# Patient Record
Sex: Male | Born: 1965
Health system: Southern US, Community
[De-identification: ages and names within clinical notes are randomized; demographics above are authoritative.]

## PROBLEM LIST (undated history)

## (undated) DIAGNOSIS — C801 Malignant (primary) neoplasm, unspecified: Secondary | ICD-10-CM

## (undated) DIAGNOSIS — K219 Gastro-esophageal reflux disease without esophagitis: Secondary | ICD-10-CM

## (undated) DIAGNOSIS — C140 Malignant neoplasm of pharynx, unspecified: Secondary | ICD-10-CM

## (undated) DIAGNOSIS — Z8547 Personal history of malignant neoplasm of testis: Secondary | ICD-10-CM

## (undated) DIAGNOSIS — I10 Essential (primary) hypertension: Secondary | ICD-10-CM

## (undated) DIAGNOSIS — E7800 Pure hypercholesterolemia, unspecified: Secondary | ICD-10-CM

## (undated) DIAGNOSIS — C01 Malignant neoplasm of base of tongue: Secondary | ICD-10-CM

## (undated) DIAGNOSIS — E785 Hyperlipidemia, unspecified: Secondary | ICD-10-CM

## (undated) HISTORY — PX: TONSILLECTOMY: SUR1361

## (undated) HISTORY — PX: KNEE SURGERY: SHX244

## (undated) HISTORY — PX: RADICAL ORCHIECTOMY: SHX2285

## (undated) HISTORY — DX: Personal history of malignant neoplasm of testis: Z85.47

## (undated) HISTORY — PX: CHOLECYSTECTOMY: SHX55

## (undated) HISTORY — DX: Malignant (primary) neoplasm, unspecified: C80.1

## (undated) HISTORY — PX: TESTICLE SURGERY: SHX794

## (undated) HISTORY — DX: Essential (primary) hypertension: I10

## (undated) HISTORY — DX: Hyperlipidemia, unspecified: E78.5

## (undated) HISTORY — PX: HERNIA REPAIR: SHX51

## (undated) HISTORY — DX: Gastro-esophageal reflux disease without esophagitis: K21.9

## (undated) HISTORY — DX: Malignant neoplasm of base of tongue: C01

## (undated) HISTORY — DX: Malignant neoplasm of pharynx, unspecified: C14.0

## (undated) HISTORY — PX: COLONOSCOPY: SHX174

## (undated) MED FILL — Ferric Derisomaltose (One Dose) IV Sol 1000 MG/10ML (Fe Eq): INTRAVENOUS | Qty: 10 | Status: AC

## (undated) MED FILL — Ferumoxytol Inj 510 MG/17ML (30 MG/ML) (Elemental Fe): INTRAVENOUS | Qty: 17 | Status: AC

---

## 2013-08-06 DIAGNOSIS — C6290 Malignant neoplasm of unspecified testis, unspecified whether descended or undescended: Secondary | ICD-10-CM

## 2013-08-06 HISTORY — DX: Malignant neoplasm of unspecified testis, unspecified whether descended or undescended: C62.90

## 2013-08-19 DIAGNOSIS — I10 Essential (primary) hypertension: Secondary | ICD-10-CM

## 2013-08-19 DIAGNOSIS — E785 Hyperlipidemia, unspecified: Secondary | ICD-10-CM | POA: Insufficient documentation

## 2013-08-19 HISTORY — DX: Essential (primary) hypertension: I10

## 2015-01-26 ENCOUNTER — Ambulatory Visit (INDEPENDENT_AMBULATORY_CARE_PROVIDER_SITE_OTHER): Payer: BLUE CROSS/BLUE SHIELD | Admitting: Emergency Medicine

## 2015-01-26 VITALS — BP 130/84 | HR 60 | Temp 98.0°F | Resp 18 | Ht 69.0 in | Wt 260.4 lb

## 2015-01-26 DIAGNOSIS — C6290 Malignant neoplasm of unspecified testis, unspecified whether descended or undescended: Secondary | ICD-10-CM | POA: Insufficient documentation

## 2015-01-26 DIAGNOSIS — C6291 Malignant neoplasm of right testis, unspecified whether descended or undescended: Secondary | ICD-10-CM | POA: Diagnosis not present

## 2015-01-26 DIAGNOSIS — I10 Essential (primary) hypertension: Secondary | ICD-10-CM | POA: Diagnosis not present

## 2015-01-26 HISTORY — DX: Malignant neoplasm of unspecified testis, unspecified whether descended or undescended: C62.90

## 2015-01-26 MED ORDER — HYDROCHLOROTHIAZIDE 25 MG PO TABS: 25.0000 mg | ORAL_TABLET | Freq: Every day | ORAL | Status: DC

## 2015-01-26 MED ORDER — LISINOPRIL 20 MG PO TABS: 20.0000 mg | ORAL_TABLET | Freq: Every day | ORAL | Status: DC

## 2015-01-26 NOTE — Progress Notes (Signed)
Subjective:  Patient ID: Matthew Hatfield, male    DOB: 04-Apr-1965  Age: 49 y.o. MRN: [id number]  CC: Medication Refill   HPI Matthew Hatfield presents  he has a history of hypertension and testicular cancer. He is needing to establish care with a new practitioner his regular doctor is closed. He has some medicine remaining but is nearly out. He recently had labs he said it is regular doctor and they were "normal" he has no acute complaint or medical bleeding.  History Osa has a past medical history of Cancer ([place]) and Hypertension.   He has past surgical history that includes Cholecystectomy and Hernia repair.   His  family history includes Cancer in his father and mother; Heart disease in his mother.  He   reports that he has never smoked. He does not have any smokeless tobacco history on file. He reports that he does not drink alcohol or use illicit drugs.  No outpatient prescriptions prior to visit.   No facility-administered medications prior to visit.    Social History   Social History  . Marital Status: Married    Spouse Name: N/A  . Number of Children: N/A  . Years of Education: N/A   Social History Main Topics  . Smoking status: Never Smoker   . Smokeless tobacco: None  . Alcohol Use: No  . Drug Use: No  . Sexual Activity: Not Asked   Other Topics Concern  . None   Social History Narrative  . None     Review of Systems  Constitutional: Negative for fever, chills and appetite change.  HENT: Negative for congestion, ear pain, postnasal drip, sinus pressure and sore throat.   Eyes: Negative for pain and redness.  Respiratory: Negative for cough, shortness of breath and wheezing.   Cardiovascular: Negative for leg swelling.  Gastrointestinal: Negative for nausea, vomiting, abdominal pain, diarrhea, constipation and blood in stool.  Endocrine: Negative for polyuria.  Genitourinary: Negative for dysuria, urgency, frequency and flank pain.    Musculoskeletal: Negative for gait problem.  Skin: Negative for rash.  Neurological: Negative for weakness and headaches.  Psychiatric/Behavioral: Negative for confusion and decreased concentration. The patient is not nervous/anxious.     Objective:  BP 130/84 mmHg  Pulse 60  Temp(Src) 98 F (36.7 C) (Oral)  Resp 18  Ht 5\' 9"  (1.753 m)  Wt 260 lb 6.4 oz (118.117 kg)  BMI 38.44 kg/m2  SpO2 96%  Physical Exam  Constitutional: He is oriented to person, place, and time. He appears well-developed and well-nourished. No distress.  HENT:  Head: Normocephalic and atraumatic.  Right Ear: External ear normal.  Left Ear: External ear normal.  Nose: Nose normal.  Eyes: Conjunctivae and EOM are normal. Pupils are equal, round, and reactive to light. No scleral icterus.  Neck: Normal range of motion. Neck supple. No tracheal deviation present.  Cardiovascular: Normal rate, regular rhythm and normal heart sounds.   Pulmonary/Chest: Effort normal. No respiratory distress. He has no wheezes. He has no rales.  Abdominal: He exhibits no mass. There is no tenderness. There is no rebound and no guarding.  Musculoskeletal: He exhibits no edema.  Lymphadenopathy:    He has no cervical adenopathy.  Neurological: He is alert and oriented to person, place, and time. Coordination normal.  Skin: Skin is warm and dry. No rash noted.  Psychiatric: He has a normal mood and affect. His behavior is normal.      Assessment & Plan:   Matthew was seen  today for medication refill.  Diagnoses and all orders for this visit:  Essential hypertension, benign  Malignant neoplasm of right testis, unspecified whether descended or undescended ([place])  Other orders -     lisinopril (PRINIVIL,ZESTRIL) 20 MG tablet; Take 1 tablet (20 mg total) by mouth daily. -     hydrochlorothiazide (HYDRODIURIL) 25 MG tablet; Take 1 tablet (25 mg total) by mouth daily.   I have discontinued Mr. Hatfield HYDROCHLOROTHIAZIDE  PO. I have also changed his lisinopril and hydrochlorothiazide.  Meds ordered this encounter  Medications  . DISCONTD: lisinopril (PRINIVIL,ZESTRIL) 20 MG tablet    Sig: Take 20 mg by mouth daily.  Marland Kitchen DISCONTD: HYDROCHLOROTHIAZIDE PO    Sig: Take 10 mg by mouth.  . DISCONTD: hydrochlorothiazide (HYDRODIURIL) 25 MG tablet    Sig: Take 25 mg by mouth daily.  Marland Kitchen lisinopril (PRINIVIL,ZESTRIL) 20 MG tablet    Sig: Take 1 tablet (20 mg total) by mouth daily.    Dispense:  30 tablet    Refill:  5  . hydrochlorothiazide (HYDRODIURIL) 25 MG tablet    Sig: Take 1 tablet (25 mg total) by mouth daily.    Dispense:  30 tablet    Refill:  5    Appropriate red flag conditions were discussed with the patient as well as actions that should be taken.  Patient expressed his understanding.  Follow-up: Return in about 6 months (around 07/27/2015).  Roselee Culver, MD

## 2015-01-26 NOTE — Patient Instructions (Signed)
Hypertension Hypertension, commonly called high blood pressure, is when the force of blood pumping through your arteries is too strong. Your arteries are the blood vessels that carry blood from your heart throughout your body. A blood pressure reading consists of a higher number over a lower number, such as 110/72. The higher number (systolic) is the pressure inside your arteries when your heart pumps. The lower number (diastolic) is the pressure inside your arteries when your heart relaxes. Ideally you want your blood pressure below 120/80. Hypertension forces your heart to work harder to pump blood. Your arteries may become narrow or stiff. Having untreated or uncontrolled hypertension can cause heart attack, stroke, kidney disease, and other problems. RISK FACTORS Some risk factors for high blood pressure are controllable. Others are not.  Risk factors you cannot control include:   Race. You may be at higher risk if you are African American.  Age. Risk increases with age.  Gender. Men are at higher risk than women before age 45 years. After age 65, women are at higher risk than men. Risk factors you can control include:  Not getting enough exercise or physical activity.  Being overweight.  Getting too much fat, sugar, calories, or salt in your diet.  Drinking too much alcohol. SIGNS AND SYMPTOMS Hypertension does not usually cause signs or symptoms. Extremely high blood pressure (hypertensive crisis) may cause headache, anxiety, shortness of breath, and nosebleed. DIAGNOSIS To check if you have hypertension, your health care provider will measure your blood pressure while you are seated, with your arm held at the level of your heart. It should be measured at least twice using the same arm. Certain conditions can cause a difference in blood pressure between your right and left arms. A blood pressure reading that is higher than normal on one occasion does not mean that you need treatment. If  it is not clear whether you have high blood pressure, you may be asked to return on a different day to have your blood pressure checked again. Or, you may be asked to monitor your blood pressure at home for 1 or more weeks. TREATMENT Treating high blood pressure includes making lifestyle changes and possibly taking medicine. Living a healthy lifestyle can help lower high blood pressure. You may need to change some of your habits. Lifestyle changes may include:  Following the DASH diet. This diet is high in fruits, vegetables, and whole grains. It is low in salt, red meat, and added sugars.  Keep your sodium intake below 2,300 mg per day.  Getting at least 30-45 minutes of aerobic exercise at least 4 times per week.  Losing weight if necessary.  Not smoking.  Limiting alcoholic beverages.  Learning ways to reduce stress. Your health care provider may prescribe medicine if lifestyle changes are not enough to get your blood pressure under control, and if one of the following is true:  You are 18-59 years of age and your systolic blood pressure is above 140.  You are 60 years of age or older, and your systolic blood pressure is above 150.  Your diastolic blood pressure is above 90.  You have diabetes, and your systolic blood pressure is over 140 or your diastolic blood pressure is over 90.  You have kidney disease and your blood pressure is above 140/90.  You have heart disease and your blood pressure is above 140/90. Your personal target blood pressure may vary depending on your medical conditions, your age, and other factors. HOME CARE INSTRUCTIONS    Have your blood pressure rechecked as directed by your health care provider.   Take medicines only as directed by your health care provider. Follow the directions carefully. Blood pressure medicines must be taken as prescribed. The medicine does not work as well when you skip doses. Skipping doses also puts you at risk for  problems.  Do not smoke.   Monitor your blood pressure at home as directed by your health care provider. SEEK MEDICAL CARE IF:   You think you are having a reaction to medicines taken.  You have recurrent headaches or feel dizzy.  You have swelling in your ankles.  You have trouble with your vision. SEEK IMMEDIATE MEDICAL CARE IF:  You develop a severe headache or confusion.  You have unusual weakness, numbness, or feel faint.  You have severe chest or abdominal pain.  You vomit repeatedly.  You have trouble breathing. MAKE SURE YOU:   Understand these instructions.  Will watch your condition.  Will get help right away if you are not doing well or get worse.   This information is not intended to replace advice given to you by your health care provider. Make sure you discuss any questions you have with your health care provider.   Document Released: 01/30/2005 Document Revised: 06/16/2014 Document Reviewed: 11/22/2012 Elsevier Interactive Patient Education 2016 Elsevier Inc.  

## 2015-02-08 ENCOUNTER — Ambulatory Visit (INDEPENDENT_AMBULATORY_CARE_PROVIDER_SITE_OTHER): Payer: BLUE CROSS/BLUE SHIELD | Admitting: Family Medicine

## 2015-02-08 VITALS — BP 166/80 | HR 81 | Temp 98.5°F | Resp 18 | Ht 69.0 in | Wt 258.0 lb

## 2015-02-08 DIAGNOSIS — J683 Other acute and subacute respiratory conditions due to chemicals, gases, fumes and vapors: Secondary | ICD-10-CM

## 2015-02-08 DIAGNOSIS — J4520 Mild intermittent asthma, uncomplicated: Secondary | ICD-10-CM

## 2015-02-08 MED ORDER — HYDROCODONE-HOMATROPINE 5-1.5 MG/5ML PO SYRP: 5.0000 mL | ORAL_SOLUTION | Freq: Three times a day (TID) | ORAL | Status: DC | PRN

## 2015-02-08 MED ORDER — ALBUTEROL SULFATE 108 (90 BASE) MCG/ACT IN AEPB: 2.0000 | INHALATION_SPRAY | Freq: Four times a day (QID) | RESPIRATORY_TRACT | Status: DC | PRN

## 2015-02-08 MED ORDER — AZITHROMYCIN 250 MG PO TABS: ORAL_TABLET | ORAL | Status: DC

## 2015-02-08 NOTE — Patient Instructions (Signed)

## 2015-02-08 NOTE — Progress Notes (Signed)
@UMFCLOGO @  By signing my name below, I, Raven Small, attest that this documentation has been prepared under the direction and in the presence of Robyn Haber, MD.  Electronically Signed: Thea Alken, ED Scribe. 02/08/2015. 5:29 PM.   Patient ID: Matthew Hatfield MRN: [id number], DOB: 1965-09-15, 49 y.o. Date of Encounter: 02/08/2015, 5:26 PM  Primary Physician: No PCP Per Patient  Chief Complaint:  Chief Complaint  Patient presents with  . Cough    Productive onset 3 days    HPI: 49 y.o. year old male with history below presents with cough. Symptoms started 2 days ago with sore throat but progressively worsened yesterday with productive cough and pain with swallowing. Pt states he has not eaten today due to sore throat. He reports SOB only with coughing fits.  He denies hx of asthma. He denies nausea and emesis.   Pt works as a Advice worker.   Past Medical History  Diagnosis Date  . Cancer ([place])   . Hypertension      Home Meds: Prior to Admission medications   Medication Sig Start Date End Date Taking? Authorizing Provider  aspirin 81 MG chewable tablet Chew 81 mg by mouth.   Yes Historical Provider, MD  docusate sodium (STOOL SOFTENER) 100 MG capsule Take 100 mg by mouth.   Yes Historical Provider, MD  hydrochlorothiazide (HYDRODIURIL) 25 MG tablet Take 1 tablet (25 mg total) by mouth daily. 01/26/15  Yes Roselee Culver, MD  lisinopril (PRINIVIL,ZESTRIL) 20 MG tablet Take 1 tablet (20 mg total) by mouth daily. 01/26/15  Yes Roselee Culver, MD    Allergies:  Allergies  Allergen Reactions  . Shellfish-Derived Products Hives  . Antihistamines, Diphenhydramine-Type Palpitations    Social History   Social History  . Marital Status: Married    Spouse Name: N/A  . Number of Children: N/A  . Years of Education: N/A   Occupational History  . Not on file.   Social History Main Topics  . Smoking status: Never Smoker   . Smokeless tobacco: Not on file  .  Alcohol Use: No  . Drug Use: No  . Sexual Activity: Not on file   Other Topics Concern  . Not on file   Social History Narrative     Review of Systems: Constitutional: negative for chills, fever, night sweats, weight changes, or fatigue  HEENT: negative for vision changes, hearing loss, congestion, rhinorrhea, ST, epistaxis, or sinus pressure. Sore throat Cardiovascular: negative for chest pain or palpitations Respiratory: negative for hemoptysis, wheezing, shortness of breath. cough Abdominal: negative for abdominal pain, nausea, vomiting, diarrhea, or constipation Dermatological: negative for rash Neurologic: negative for headache, dizziness, or syncope All other systems reviewed and are otherwise negative with the exception to those above and in the HPI.   Physical Exam: Blood pressure 166/80, pulse 81, temperature 98.5 F (36.9 C), temperature source Oral, resp. rate 18, height 5\' 9"  (1.753 m), weight 258 lb (117.028 kg), SpO2 96 %., Body mass index is 38.08 kg/(m^2). General: Well developed, well nourished, in no acute distress. Head: Normocephalic, atraumatic, eyes without discharge, sclera non-icteric, nares are without discharge. Bilateral auditory canals clear, TM's are without perforation, pearly grey and translucent with reflective cone of light bilaterally. Oral cavity moist, posterior pharynx with erythema and post nasal drainage.  Neck: Supple. No thyromegaly. Full ROM. No lymphadenopathy. Lungs: inspiratory wheeze.  Heart: RRR with S1 S2. No murmurs, rubs, or gallops appreciated. Msk:  Strength and tone normal for age. Extremities/Skin: Warm and dry.  No clubbing or cyanosis. No edema. No rashes or suspicious lesions. Neuro: Alert and oriented X 3. Moves all extremities spontaneously. Gait is normal. CNII-XII grossly in tact. Psych:  Responds to questions appropriately with a normal affect.    ASSESSMENT AND PLAN:  49 y.o. year old male with reactive  airways/bronchitis    ICD-9-CM ICD-10-CM   1. Reactive airways dysfunction syndrome, mild intermittent, uncomplicated 123456 [id number] HYDROcodone-homatropine (HYCODAN) 5-1.5 MG/5ML syrup     azithromycin (ZITHROMAX) 250 MG tablet     Albuterol Sulfate (PROAIR RESPICLICK) 123XX123 (90 BASE) MCG/ACT AEPB  This chart was scribed in my presence and reviewed by me personally.  Signed, Robyn Haber, MD 02/08/2015 5:26 PM   ]

## 2015-12-03 ENCOUNTER — Other Ambulatory Visit: Payer: Self-pay

## 2015-12-03 MED ORDER — LISINOPRIL 20 MG PO TABS: 20.0000 mg | ORAL_TABLET | Freq: Every day | ORAL | 1 refills | Status: DC

## 2015-12-03 NOTE — Telephone Encounter (Signed)
Fax req for Lisinopril 20mg . Sent 60 days and advised he needs follow up prior to further refills. LMOVM & note to pharmacy.

## 2016-03-04 ENCOUNTER — Encounter (HOSPITAL_COMMUNITY): Payer: Self-pay | Admitting: Emergency Medicine

## 2016-03-04 ENCOUNTER — Emergency Department (HOSPITAL_COMMUNITY)
Admission: EM | Admit: 2016-03-04 | Discharge: 2016-03-04 | Disposition: A | Discharge status: Home / Self Care | Payer: 59 | Attending: Emergency Medicine | Admitting: Emergency Medicine

## 2016-03-04 DIAGNOSIS — S1121XA Laceration without foreign body of pharynx and cervical esophagus, initial encounter: Secondary | ICD-10-CM | POA: Diagnosis not present

## 2016-03-04 DIAGNOSIS — Z79899 Other long term (current) drug therapy: Secondary | ICD-10-CM | POA: Insufficient documentation

## 2016-03-04 DIAGNOSIS — Z7982 Long term (current) use of aspirin: Secondary | ICD-10-CM | POA: Insufficient documentation

## 2016-03-04 DIAGNOSIS — S1985XA Other specified injuries of pharynx and cervical esophagus, initial encounter: Secondary | ICD-10-CM | POA: Diagnosis present

## 2016-03-04 DIAGNOSIS — I10 Essential (primary) hypertension: Secondary | ICD-10-CM | POA: Insufficient documentation

## 2016-03-04 DIAGNOSIS — X58XXXA Exposure to other specified factors, initial encounter: Secondary | ICD-10-CM | POA: Insufficient documentation

## 2016-03-04 DIAGNOSIS — Z8547 Personal history of malignant neoplasm of testis: Secondary | ICD-10-CM | POA: Insufficient documentation

## 2016-03-04 DIAGNOSIS — Y939 Activity, unspecified: Secondary | ICD-10-CM | POA: Insufficient documentation

## 2016-03-04 DIAGNOSIS — Y929 Unspecified place or not applicable: Secondary | ICD-10-CM | POA: Diagnosis not present

## 2016-03-04 DIAGNOSIS — Y999 Unspecified external cause status: Secondary | ICD-10-CM | POA: Diagnosis not present

## 2016-03-04 NOTE — ED Triage Notes (Signed)
Pt reports he ate a nutty bar yesterday, which he felt got stuck. Piece of food passed, but pt has been spitting up blood. Went to UC for same yesterday, no sign of any retained food noted. Still spitting blood intermittently at home. None noted in triage.

## 2016-03-04 NOTE — ED Provider Notes (Signed)
[facility] DEPT Provider Note   CSN: [id number] Arrival date & time: 03/04/16  [id number]     History   Chief Complaint Chief Complaint  Patient presents with  . spitting blood    HPI Matthew Hatfield is a 51 y.o. male.  HPI Patient was eating a nutty bar yesterday around lunch time and it got stuck in his throat. He reports it was not down farther in his chest and did not cause dysphagia. He was able to cough it out but continued to have bleeding. He was seen at urgent care yesterday and it had stopped. He denies he has had any ongoing pain. He has been eating and drinking normally without dysphagia or pain. He reports he continues however to intermittently spit out red blood. No other associated symptoms. It is not causing difficulty breathing or choking sensation. Past Medical History:  Diagnosis Date  . Cancer ([place])   . Hypertension     Patient Active Problem List   Diagnosis Date Noted  . Testicular cancer ([place]) 01/26/2015    Past Surgical History:  Procedure Laterality Date  . CHOLECYSTECTOMY    . HERNIA REPAIR         Home Medications    Prior to Admission medications   Medication Sig Start Date End Date Taking? Authorizing Provider  Albuterol Sulfate (PROAIR RESPICLICK) 123XX123 (90 BASE) MCG/ACT AEPB Inhale 2 puffs into the lungs every 6 (six) hours as needed. 02/08/15   Robyn Haber, MD  aspirin 81 MG chewable tablet Chew 81 mg by mouth.    Historical Provider, MD  azithromycin (ZITHROMAX) 250 MG tablet Take 2 tabs PO x 1 dose, then 1 tab PO QD x 4 days 02/08/15   Robyn Haber, MD  docusate sodium (STOOL SOFTENER) 100 MG capsule Take 100 mg by mouth.    Historical Provider, MD  hydrochlorothiazide (HYDRODIURIL) 25 MG tablet Take 1 tablet (25 mg total) by mouth daily. 01/26/15   Roselee Culver, MD  HYDROcodone-homatropine [facility]) 5-1.5 MG/5ML syrup Take 5 mLs by mouth every 8 (eight) hours as needed for cough. 02/08/15   Robyn Haber, MD  lisinopril  (PRINIVIL,ZESTRIL) 20 MG tablet Take 1 tablet (20 mg total) by mouth daily. 12/03/15   Wardell Honour, MD    Family History Family History  Problem Relation Age of Onset  . Cancer Mother   . Heart disease Mother   . Cancer Father     Social History Social History  Substance Use Topics  . Smoking status: Never Smoker  . Smokeless tobacco: Not on file  . Alcohol use No     Allergies   Shellfish-derived products and Antihistamines, diphenhydramine-type   Review of Systems Review of Systems Consultation: No fever chills or general malaise ENT: No recent sinus congestion pressure nasal drainage discharge or bleeding or sore throat. Respiratory: No cough, mucous production or fever.  Physical Exam Updated Vital Signs BP 146/94 (BP Location: Right Arm)   Pulse 69   Temp 98 F (36.7 C) (Oral)   Resp 18   SpO2 97%   Physical Exam  Constitutional: He is oriented to person, place, and time. He appears well-developed and well-nourished.  Patient is in no distress. Respirations are normal.  HENT:  Head: Normocephalic and atraumatic.  Bilateral TMs normal without any hemotympanum. Bilateral nares normal no clot or evidence of bleeding. Oral mucosa is normal. Posterior oropharynx visualized to best of complete ability with tongue depressor and illumination. No evident lacerations to the palate, uvula or  tonsillar pillars. None to the tongue or beneath the tongue. Patient can however spit out a blood-tinged saliva at will.  Eyes: EOM are normal. Pupils are equal, round, and reactive to light.  Neck: Neck supple.  Cardiovascular: Normal rate, regular rhythm, normal heart sounds and intact distal pulses.   Pulmonary/Chest: Effort normal and breath sounds normal. No stridor.  Musculoskeletal: Normal range of motion.  Neurological: He is alert and oriented to person, place, and time. No cranial nerve deficit. Coordination normal.  Skin: Skin is warm and dry.  Psychiatric: He has a  normal mood and affect.     ED Treatments / Results  Labs (all labs ordered are listed, but only abnormal results are displayed) Labs Reviewed - No data to display  EKG  EKG Interpretation None       Radiology No results found.  Procedures Procedures (including critical care time)  Medications Ordered in ED Medications - No data to display   Initial Impression / Assessment and Plan / ED Course  I have reviewed the triage vital signs and the nursing notes.  Pertinent labs & imaging results that were available during my care of the patient were reviewed by me and considered in my medical decision making (see chart for details).     Final Clinical Impressions(s) / ED Diagnoses   Final diagnoses:  Pharyngeal laceration, initial encounter  By history patient likely has a posterior oropharyngeal laceration that I am not able to visualize on direct examination. His causing him no distress in that there is no pain and no respiratory symptoms. He is eating and drinking without difficulty. Bleeding is small in volume. Patient is given follow-up information for ENT. And return instructions should symptoms worsen or change.  New Prescriptions New Prescriptions   No medications on file     Charlesetta Shanks, MD 03/04/16 [phone]

## 2016-03-15 DIAGNOSIS — L821 Other seborrheic keratosis: Secondary | ICD-10-CM

## 2016-03-15 HISTORY — DX: Other seborrheic keratosis: L82.1

## 2016-06-11 ENCOUNTER — Other Ambulatory Visit: Payer: Self-pay | Admitting: Family Medicine

## 2016-09-11 ENCOUNTER — Other Ambulatory Visit: Payer: Self-pay | Admitting: Family Medicine

## 2017-03-06 ENCOUNTER — Other Ambulatory Visit: Payer: Self-pay

## 2017-03-06 ENCOUNTER — Emergency Department (HOSPITAL_COMMUNITY): Payer: BLUE CROSS/BLUE SHIELD

## 2017-03-06 ENCOUNTER — Encounter (HOSPITAL_COMMUNITY): Payer: Self-pay | Admitting: Emergency Medicine

## 2017-03-06 ENCOUNTER — Emergency Department (HOSPITAL_COMMUNITY)
Admission: EM | Admit: 2017-03-06 | Discharge: 2017-03-06 | Disposition: A | Discharge status: Home / Self Care | Payer: BLUE CROSS/BLUE SHIELD | Attending: Emergency Medicine | Admitting: Emergency Medicine

## 2017-03-06 DIAGNOSIS — R042 Hemoptysis: Secondary | ICD-10-CM | POA: Diagnosis present

## 2017-03-06 DIAGNOSIS — I10 Essential (primary) hypertension: Secondary | ICD-10-CM | POA: Diagnosis not present

## 2017-03-06 DIAGNOSIS — Z79899 Other long term (current) drug therapy: Secondary | ICD-10-CM | POA: Insufficient documentation

## 2017-03-06 DIAGNOSIS — R05 Cough: Secondary | ICD-10-CM | POA: Insufficient documentation

## 2017-03-06 DIAGNOSIS — R059 Cough, unspecified: Secondary | ICD-10-CM

## 2017-03-06 DIAGNOSIS — Z7982 Long term (current) use of aspirin: Secondary | ICD-10-CM | POA: Insufficient documentation

## 2017-03-06 HISTORY — DX: Pure hypercholesterolemia, unspecified: E78.00

## 2017-03-06 NOTE — Discharge Instructions (Signed)
It was my pleasure taking care of you today!   Fortunately, your x-ray and exam was reassuring today.   You need to follow up with ENT (ear, nose and throat). Please call today or tomorrow to schedule this follow up appointment.   Return to ER for new or worsening symptoms, any additional concerns.

## 2017-03-06 NOTE — ED Provider Notes (Signed)
[facility] DEPT Provider Note   CSN: [id number] Arrival date & time: 03/06/17  0458     History   Chief Complaint Chief Complaint  Patient presents with  . coughing up blood    HPI Matthew Hatfield is a 52 y.o. male.  The history is provided by the patient and medical records. No language interpreter was used.   Matthew Hatfield is a 52 y.o. male  with a PMH of HTN, HLD, testicular cancer several years ago s/p orchiectomy in 2008 who presents to the Emergency Department complaining of coughing up blood this morning. Patient states that he was in his usual state of health last night. He woke up this morning around 4 am. He felt well. He cleared his throat and then coughed. When he coughed, he had blood clot a little smaller than a pea and another clot he described as "thin, stringy" and "slimy". He has had no further episodes since which has been about 5-6 hours. He did cough again in the waiting room, but no hemoptysis. No medications or treatments prior to arrival for symptoms.  Patient denies fever, congestion, chest pain, shortness of breath.  No recent travel.  Patient did have a similar episode about 1 year ago, however this was after he got a [employer] bar stuck in his throat and believes the hemoptysis was due to a scratch because of that.  This self resolved in about 2 days.  Past Medical History:  Diagnosis Date  . Cancer ([place])   . High cholesterol   . Hypertension     Patient Active Problem List   Diagnosis Date Noted  . Testicular cancer ([place]) 01/26/2015    Past Surgical History:  Procedure Laterality Date  . CHOLECYSTECTOMY    . HERNIA REPAIR    . KNEE SURGERY    . TESTICLE SURGERY    . TONSILLECTOMY         Home Medications    Prior to Admission medications   Medication Sig Start Date End Date Taking? Authorizing Provider  amLODipine (NORVASC) 5 MG tablet Take 5 mg by mouth daily. 02/24/17  Yes [provider]    aspirin 81 MG chewable tablet Chew 81 mg by mouth.   Yes [provider]  atorvastatin (LIPITOR) 20 MG tablet Take 20 mg by mouth daily.   Yes [provider]  docusate sodium (STOOL SOFTENER) 100 MG capsule Take 100 mg by mouth.   Yes [provider]  hydrochlorothiazide (HYDRODIURIL) 25 MG tablet Take 1 tablet (25 mg total) by mouth daily. 01/26/15  Yes Roselee Culver, MD  lisinopril (PRINIVIL,ZESTRIL) 40 MG tablet Take 40 mg by mouth daily. 01/25/17  Yes [provider]  Albuterol Sulfate (PROAIR RESPICLICK) 409 (90 BASE) MCG/ACT AEPB Inhale 2 puffs into the lungs every 6 (six) hours as needed. Patient not taking: Reported on 03/06/2017 02/08/15   Robyn Haber, MD  azithromycin (ZITHROMAX) 250 MG tablet Take 2 tabs PO x 1 dose, then 1 tab PO QD x 4 days Patient not taking: Reported on 03/06/2017 02/08/15   Robyn Haber, MD  HYDROcodone-homatropine [facility]) 5-1.5 MG/5ML syrup Take 5 mLs by mouth every 8 (eight) hours as needed for cough. Patient not taking: Reported on 03/06/2017 02/08/15   Robyn Haber, MD  lisinopril (PRINIVIL,ZESTRIL) 20 MG tablet Take 1 tablet (20 mg total) by mouth daily. Patient not taking: Reported on 03/06/2017 12/03/15   Wardell Honour, MD    Family History Family History  Problem Relation Age of Onset  . Cancer Mother   . Heart disease Mother   . Cancer Father     Social History Social History   Tobacco Use  . Smoking status: Never Smoker  . Smokeless tobacco: Never Used  Substance Use Topics  . Alcohol use: No    Alcohol/week: 0.0 oz  . Drug use: No     Allergies   Shellfish-derived products and Antihistamines, diphenhydramine-type   Review of Systems Review of Systems  Respiratory: Positive for cough. Negative for shortness of breath and wheezing.        + hemoptysis.  All other systems reviewed and are negative.    Physical Exam Updated Vital Signs BP (!) 154/90 (BP Location: Right  Arm)   Pulse 66   Temp 98 F (36.7 C) (Oral)   Resp 18   SpO2 99%   Physical Exam  Constitutional: He is oriented to person, place, and time. He appears well-developed and well-nourished. No distress.  HENT:  Head: Normocephalic and atraumatic.  OP clear and moist. No erythema. No active bleeding or signs of bleeding appreciated.  Cardiovascular: Normal rate, regular rhythm and normal heart sounds.  No murmur heard. Pulmonary/Chest: Effort normal and breath sounds normal. No respiratory distress.  Lungs clear to auscultation bilaterally.  Abdominal: Soft. He exhibits no distension. There is no tenderness.  Neurological: He is alert and oriented to person, place, and time.  Skin: Skin is warm and dry.  Nursing note and vitals reviewed.    ED Treatments / Results  Labs (all labs ordered are listed, but only abnormal results are displayed) Labs Reviewed - No data to display  EKG  EKG Interpretation None       Radiology Dg Chest 2 View  Result Date: 03/06/2017 CLINICAL DATA:  Coughing up blood since this morning. EXAM: CHEST  2 VIEW COMPARISON:  None. FINDINGS: Lungs are adequately inflated and otherwise clear. Borderline cardiomegaly. Remaining bones and soft tissues are unremarkable. IMPRESSION: No active cardiopulmonary disease. Electronically Signed   By: Marin Olp M.D.   On: 03/06/2017 10:19    Procedures Procedures (including critical care time)  Medications Ordered in ED Medications - No data to display   Initial Impression / Assessment and Plan / ED Course  I have reviewed the triage vital signs and the nursing notes.  Pertinent labs & imaging results that were available during my care of the patient were reviewed by me and considered in my medical decision making (see chart for details).    Matthew Hatfield is a 52 y.o. male who presents to ED for one episode of hemoptysis this morning around 4 am. He has had no further episodes in the last 5-6 hours.  Lungs CTA. ENT exam normal. CXR negative. Referred to ENT for further work up. Spoke with patient and significant other at length about return precautions. All questions answered.   Patient discussed with Dr. Zenia Resides who agrees with treatment plan.     Final Clinical Impressions(s) / ED Diagnoses   Final diagnoses:  Cough    ED Discharge Orders    None       Ward, Ozella Almond, PA-C 03/06/17 1116    Lacretia Leigh, MD 03/07/17 2146

## 2017-03-06 NOTE — ED Triage Notes (Signed)
Pt states he got up this morning and used the bathroom and while he was in there he coughed and had a mouth full of blood  Pt denies pain   States he takes a baby aspirin a day

## 2017-03-06 NOTE — ED Notes (Signed)
Bed: WA04 Expected date:  Expected time:  Means of arrival:  Comments: 

## 2017-03-20 DIAGNOSIS — K219 Gastro-esophageal reflux disease without esophagitis: Secondary | ICD-10-CM | POA: Insufficient documentation

## 2017-03-20 DIAGNOSIS — R042 Hemoptysis: Secondary | ICD-10-CM | POA: Insufficient documentation

## 2017-03-20 DIAGNOSIS — R059 Cough, unspecified: Secondary | ICD-10-CM | POA: Insufficient documentation

## 2017-03-20 DIAGNOSIS — D3705 Neoplasm of uncertain behavior of pharynx: Secondary | ICD-10-CM

## 2017-03-20 HISTORY — DX: Neoplasm of uncertain behavior of pharynx: D37.05

## 2017-03-20 HISTORY — DX: Gastro-esophageal reflux disease without esophagitis: K21.9

## 2017-03-20 HISTORY — DX: Hemoptysis: R04.2

## 2017-03-20 HISTORY — DX: Cough, unspecified: R05.9

## 2017-03-26 ENCOUNTER — Encounter (HOSPITAL_BASED_OUTPATIENT_CLINIC_OR_DEPARTMENT_OTHER): Payer: Self-pay

## 2017-03-26 ENCOUNTER — Ambulatory Visit (HOSPITAL_BASED_OUTPATIENT_CLINIC_OR_DEPARTMENT_OTHER): Admit: 2017-03-26 | Payer: BLUE CROSS/BLUE SHIELD | Admitting: Otolaryngology

## 2017-03-26 SURGERY — LARYNGOSCOPY, WITH BRONCHOSCOPY AND ESOPHAGOSCOPY
Anesthesia: General

## 2017-04-05 ENCOUNTER — Other Ambulatory Visit: Payer: Self-pay | Admitting: Otolaryngology

## 2017-04-10 ENCOUNTER — Other Ambulatory Visit: Payer: Self-pay | Admitting: Otolaryngology

## 2017-04-10 DIAGNOSIS — C01 Malignant neoplasm of base of tongue: Secondary | ICD-10-CM

## 2017-04-21 ENCOUNTER — Ambulatory Visit
Admission: RE | Admit: 2017-04-21 | Discharge: 2017-04-21 | Disposition: A | Discharge status: Home / Self Care | Payer: BLUE CROSS/BLUE SHIELD | Source: Ambulatory Visit | Attending: Otolaryngology | Admitting: Otolaryngology

## 2017-04-21 DIAGNOSIS — C01 Malignant neoplasm of base of tongue: Secondary | ICD-10-CM

## 2017-04-21 MED ORDER — IOPAMIDOL (ISOVUE-300) INJECTION 61%
75.0000 mL | Freq: Once | INTRAVENOUS | Status: AC | PRN
  Administered 2017-04-21: 75 mL via INTRAVENOUS

## 2017-04-26 DIAGNOSIS — Z923 Personal history of irradiation: Secondary | ICD-10-CM | POA: Diagnosis not present

## 2017-04-26 DIAGNOSIS — Z8547 Personal history of malignant neoplasm of testis: Secondary | ICD-10-CM

## 2017-04-26 DIAGNOSIS — G629 Polyneuropathy, unspecified: Secondary | ICD-10-CM | POA: Diagnosis not present

## 2017-04-26 DIAGNOSIS — C01 Malignant neoplasm of base of tongue: Secondary | ICD-10-CM | POA: Diagnosis not present

## 2017-05-29 DIAGNOSIS — C01 Malignant neoplasm of base of tongue: Secondary | ICD-10-CM | POA: Diagnosis not present

## 2017-06-11 DIAGNOSIS — K1231 Oral mucositis (ulcerative) due to antineoplastic therapy: Secondary | ICD-10-CM

## 2017-06-11 DIAGNOSIS — C01 Malignant neoplasm of base of tongue: Secondary | ICD-10-CM

## 2017-06-11 DIAGNOSIS — R944 Abnormal results of kidney function studies: Secondary | ICD-10-CM | POA: Diagnosis not present

## 2017-06-25 DIAGNOSIS — C01 Malignant neoplasm of base of tongue: Secondary | ICD-10-CM | POA: Diagnosis not present

## 2017-06-25 DIAGNOSIS — K1231 Oral mucositis (ulcerative) due to antineoplastic therapy: Secondary | ICD-10-CM

## 2017-07-09 ENCOUNTER — Encounter: Payer: Self-pay | Admitting: Family Medicine

## 2017-07-30 DIAGNOSIS — K1231 Oral mucositis (ulcerative) due to antineoplastic therapy: Secondary | ICD-10-CM | POA: Diagnosis not present

## 2017-07-30 DIAGNOSIS — Z923 Personal history of irradiation: Secondary | ICD-10-CM

## 2017-07-30 DIAGNOSIS — Z9221 Personal history of antineoplastic chemotherapy: Secondary | ICD-10-CM

## 2017-07-30 DIAGNOSIS — C01 Malignant neoplasm of base of tongue: Secondary | ICD-10-CM

## 2017-10-04 DIAGNOSIS — Z923 Personal history of irradiation: Secondary | ICD-10-CM

## 2017-10-04 DIAGNOSIS — R590 Localized enlarged lymph nodes: Secondary | ICD-10-CM

## 2017-10-04 DIAGNOSIS — Z9221 Personal history of antineoplastic chemotherapy: Secondary | ICD-10-CM | POA: Diagnosis not present

## 2017-10-04 DIAGNOSIS — C01 Malignant neoplasm of base of tongue: Secondary | ICD-10-CM | POA: Diagnosis not present

## 2017-12-27 DIAGNOSIS — Z923 Personal history of irradiation: Secondary | ICD-10-CM | POA: Diagnosis not present

## 2017-12-27 DIAGNOSIS — Z9221 Personal history of antineoplastic chemotherapy: Secondary | ICD-10-CM

## 2017-12-27 DIAGNOSIS — Z85810 Personal history of malignant neoplasm of tongue: Secondary | ICD-10-CM | POA: Diagnosis not present

## 2018-03-08 DIAGNOSIS — M25562 Pain in left knee: Secondary | ICD-10-CM | POA: Diagnosis not present

## 2018-03-16 IMAGING — CT CT NECK W/ CM
2 of 4 series · 5 of 14 positions shown, 6 images · IV contrast (iopamidol)
Comparison: None.

ADDENDUM:
Correction to the Findings section: The described level IIA and IIB
lymph nodes are on the right (not the left as was originally
dictated).
CLINICAL DATA: Right tongue base squamous cell carcinoma.

EXAM:
CT NECK WITH CONTRAST
TECHNIQUE: Multidetector CT imaging of the neck was performed using the
standard protocol following the bolus administration of intravenous
contrast.
CONTRAST:  75mL MZS0R2-7FF IOPAMIDOL (MZS0R2-7FF) INJECTION 61%

[Series 3: neck · axial · 0.53mm/px · z∈[-316,-168]mm · 3 of 148 slices shown]
[im 37/148  bone]
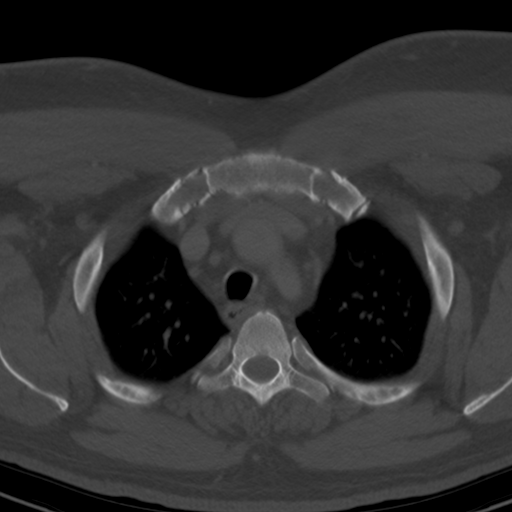
[im 74/148  bone]
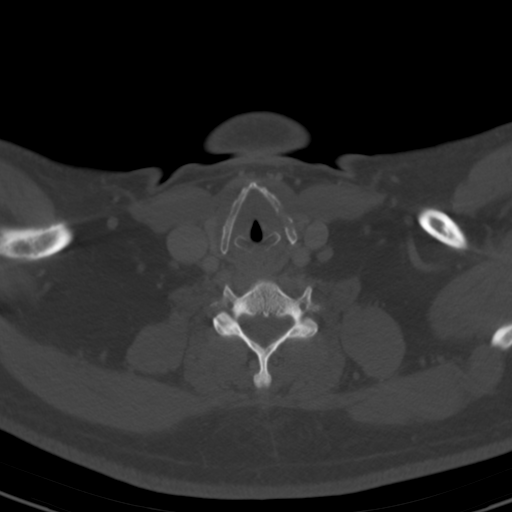
[im 111/148  bone]
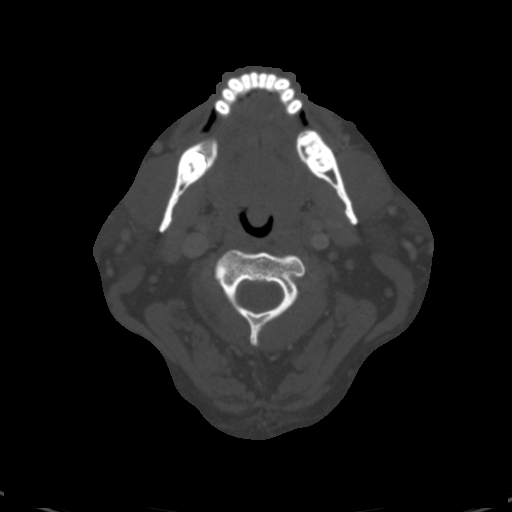

[Series 6: angled axial-oropharynx · axial · 0.56mm/px · z∈[-313,-216]mm · 2 of 149 slices shown, 3 images]
[im 50/149  soft-tissue]
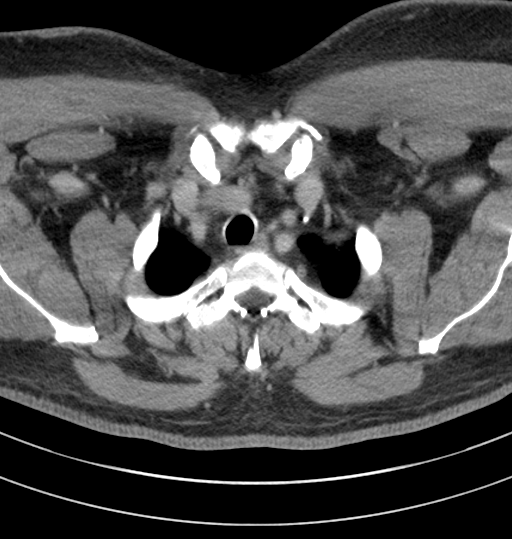
[im 50/149  bone]
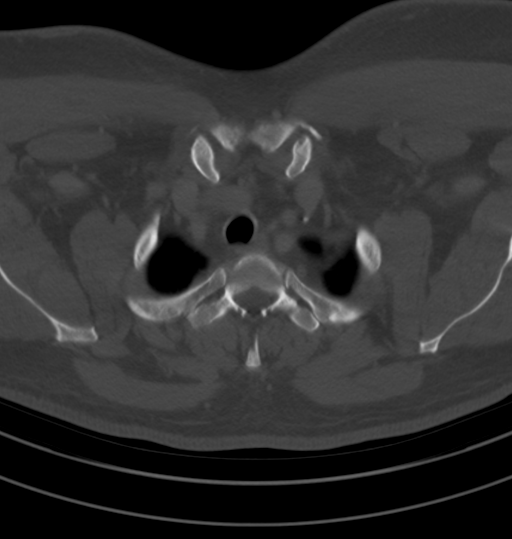
[im 99/149  bone]
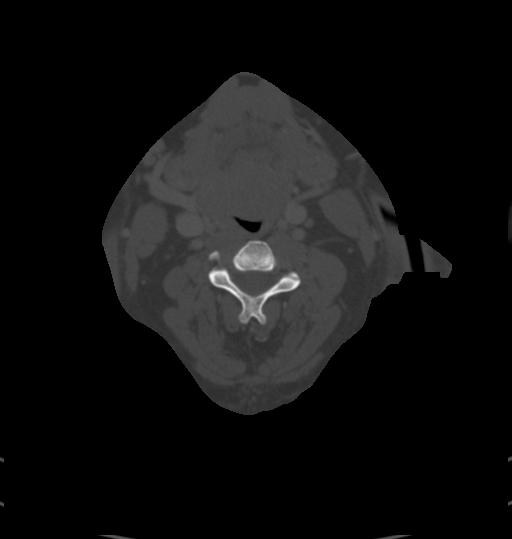

[5 of 14 positions shown; findings below may reference images not displayed]

FINDINGS: Pharynx and larynx: There is an approximately 3.4 x 2.1 x 3.0 cm
exophytic right base of tongue mass which extends across the midline
and completely fills the vallecula. The mass inferiorly displaces
the epiglottis, which is not thickened. The larynx is unremarkable.

Salivary glands: No inflammation, mass, or stone.

Thyroid: Punctate right thyroid lobe calcification.

Lymph nodes: Left level IIA and II B lymph nodes measure up to 10 mm
in short axis and are enlarged compared to the contralateral side.
Level III lymph nodes are small but conspicuous and mildly rounded,
measuring up to 7 mm in short axis on the right and 8 mm on the
left. There is also an 8 mm right level V lymph node.

Vascular: Unremarkable.

Limited intracranial: Incidental mega cisterna magna.

Visualized orbits: Unremarkable.

Mastoids and visualized paranasal sinuses: Minimal left greater than
right maxillary sinus mucosal thickening. Clear mastoid air cells.

Skeleton: Caries involving the left maxillary lateral incisor and
canine with prominent periapical lucency about the lateral incisor
including buccal surface cortical disruption. No evidence of
subperiosteal abscess. No osseous lesions suggestive of metastatic
disease.

Upper chest: Clear lung apices.

Other: None.
IMPRESSION: 1. 3.4 cm right base of tongue mass consistent with known carcinoma.
2. Mildly enlarged cervical lymph nodes, right greater than left and
suspicious for metastatic disease. Correlate with planned PET-CT.

## 2018-03-19 DIAGNOSIS — C01 Malignant neoplasm of base of tongue: Secondary | ICD-10-CM | POA: Diagnosis not present

## 2018-03-19 DIAGNOSIS — C6291 Malignant neoplasm of right testis, unspecified whether descended or undescended: Secondary | ICD-10-CM | POA: Diagnosis not present

## 2018-03-19 DIAGNOSIS — G629 Polyneuropathy, unspecified: Secondary | ICD-10-CM | POA: Diagnosis not present

## 2018-03-19 DIAGNOSIS — E291 Testicular hypofunction: Secondary | ICD-10-CM | POA: Diagnosis not present

## 2018-03-19 DIAGNOSIS — I10 Essential (primary) hypertension: Secondary | ICD-10-CM | POA: Diagnosis not present

## 2018-03-19 DIAGNOSIS — Z9079 Acquired absence of other genital organ(s): Secondary | ICD-10-CM | POA: Diagnosis not present

## 2018-03-28 DIAGNOSIS — C01 Malignant neoplasm of base of tongue: Secondary | ICD-10-CM | POA: Diagnosis not present

## 2018-04-15 DIAGNOSIS — Z6833 Body mass index (BMI) 33.0-33.9, adult: Secondary | ICD-10-CM | POA: Diagnosis not present

## 2018-04-15 DIAGNOSIS — I10 Essential (primary) hypertension: Secondary | ICD-10-CM | POA: Diagnosis not present

## 2018-04-15 DIAGNOSIS — E785 Hyperlipidemia, unspecified: Secondary | ICD-10-CM | POA: Diagnosis not present

## 2018-04-23 DIAGNOSIS — C01 Malignant neoplasm of base of tongue: Secondary | ICD-10-CM | POA: Diagnosis not present

## 2018-04-25 DIAGNOSIS — Z85810 Personal history of malignant neoplasm of tongue: Secondary | ICD-10-CM | POA: Diagnosis not present

## 2018-04-25 DIAGNOSIS — Z79899 Other long term (current) drug therapy: Secondary | ICD-10-CM | POA: Diagnosis not present

## 2018-08-07 DIAGNOSIS — Z85810 Personal history of malignant neoplasm of tongue: Secondary | ICD-10-CM | POA: Diagnosis not present

## 2018-08-07 DIAGNOSIS — C01 Malignant neoplasm of base of tongue: Secondary | ICD-10-CM | POA: Diagnosis not present

## 2018-08-26 DIAGNOSIS — C01 Malignant neoplasm of base of tongue: Secondary | ICD-10-CM | POA: Diagnosis not present

## 2018-08-26 DIAGNOSIS — Z79899 Other long term (current) drug therapy: Secondary | ICD-10-CM | POA: Diagnosis not present

## 2018-08-27 DIAGNOSIS — Z85818 Personal history of malignant neoplasm of other sites of lip, oral cavity, and pharynx: Secondary | ICD-10-CM | POA: Diagnosis not present

## 2018-08-27 DIAGNOSIS — C01 Malignant neoplasm of base of tongue: Secondary | ICD-10-CM | POA: Diagnosis not present

## 2018-12-27 ENCOUNTER — Ambulatory Visit
Admission: EM | Admit: 2018-12-27 | Discharge: 2018-12-27 | Disposition: A | Discharge status: Home / Self Care | Payer: BC Managed Care – PPO | Attending: Emergency Medicine | Admitting: Emergency Medicine

## 2018-12-27 ENCOUNTER — Other Ambulatory Visit: Payer: Self-pay

## 2018-12-27 ENCOUNTER — Encounter: Payer: Self-pay | Admitting: Emergency Medicine

## 2018-12-27 DIAGNOSIS — Z79899 Other long term (current) drug therapy: Secondary | ICD-10-CM | POA: Diagnosis not present

## 2018-12-27 DIAGNOSIS — I10 Essential (primary) hypertension: Secondary | ICD-10-CM | POA: Diagnosis not present

## 2018-12-27 DIAGNOSIS — R220 Localized swelling, mass and lump, head: Secondary | ICD-10-CM

## 2018-12-27 DIAGNOSIS — C01 Malignant neoplasm of base of tongue: Secondary | ICD-10-CM | POA: Diagnosis not present

## 2018-12-27 MED ORDER — HYDROCHLOROTHIAZIDE 25 MG PO TABS: 25.0000 mg | ORAL_TABLET | Freq: Every day | ORAL | 0 refills | Status: DC

## 2018-12-27 MED ORDER — AMLODIPINE BESYLATE 10 MG PO TABS: 10.0000 mg | ORAL_TABLET | Freq: Every day | ORAL | 0 refills | Status: DC

## 2018-12-27 MED ORDER — LISINOPRIL 10 MG PO TABS: 40.0000 mg | ORAL_TABLET | Freq: Every day | ORAL | 0 refills | Status: DC

## 2018-12-27 NOTE — ED Notes (Signed)
Patient able to ambulate independently  

## 2018-12-27 NOTE — ED Provider Notes (Signed)
[facility]    CSN: [id number] Arrival date & time: 12/27/18  1131      History   Chief Complaint Chief Complaint  Patient presents with  . Facial Swelling    HPI Matthew Hatfield is a 53 y.o. male with history of testicular cancer, hypertension, hyperlipidemia presenting for left-sided facial swelling since this morning.  Patient denies pain, dental pain, pain with mastication, redness, inciting event.  No fever, nose pain, malaise.  Patient is not anything for this.  Of note, patient thought he was scheduling appointment with primary care: States he still has all his antihypertensives, though has not been on his statin for 2 months.  States he is running low on his antihypertensive: Brought list with current doses with him.  Patient intending to switch primary care over to [facility].   Past Medical History:  Diagnosis Date  . Cancer ([place])   . High cholesterol   . Hypertension     Patient Active Problem List   Diagnosis Date Noted  . Testicular cancer ([place]) 01/26/2015    Past Surgical History:  Procedure Laterality Date  . CHOLECYSTECTOMY    . HERNIA REPAIR    . KNEE SURGERY    . TESTICLE SURGERY    . TONSILLECTOMY         Home Medications    Prior to Admission medications   Medication Sig Start Date End Date Taking? Authorizing Provider  Albuterol Sulfate (PROAIR RESPICLICK) 123XX123 (90 BASE) MCG/ACT AEPB Inhale 2 puffs into the lungs every 6 (six) hours as needed. Patient not taking: Reported on 03/06/2017 02/08/15   Robyn Haber, MD  amLODipine (NORVASC) 10 MG tablet Take 1 tablet (10 mg total) by mouth daily. 12/27/18   Hall-Potvin, [place], PA-C  aspirin 81 MG chewable tablet Chew 81 mg by mouth.    [provider]  atorvastatin (LIPITOR) 20 MG tablet Take 5 mg by mouth daily.     [provider]  docusate sodium (STOOL SOFTENER) 100 MG capsule Take 100 mg by mouth.    [provider]  hydrochlorothiazide (HYDRODIURIL)  25 MG tablet Take 1 tablet (25 mg total) by mouth daily. 12/27/18   Hall-Potvin, [place], PA-C  lisinopril (ZESTRIL) 10 MG tablet Take 4 tablets (40 mg total) by mouth daily. 12/27/18   Hall-Potvin, [place], PA-C    Family History Family History  Problem Relation Age of Onset  . Cancer Mother   . Heart disease Mother   . Cancer Father     Social History Social History   Tobacco Use  . Smoking status: Never Smoker  . Smokeless tobacco: Never Used  Substance Use Topics  . Alcohol use: No    Alcohol/week: 0.0 standard drinks  . Drug use: No     Allergies   Lidocaine; Shellfish-derived products; and Antihistamines, diphenhydramine-type   Review of Systems Review of Systems  Constitutional: Negative for fatigue and fever.  HENT: Positive for facial swelling. Negative for congestion, dental problem, drooling, sore throat, trouble swallowing and voice change.   Eyes: Negative for photophobia, pain and visual disturbance.  Respiratory: Negative for cough and shortness of breath.   Cardiovascular: Negative for chest pain and palpitations.  Gastrointestinal: Negative for abdominal pain, diarrhea and vomiting.  Musculoskeletal: Negative for arthralgias and myalgias.  Skin: Negative for rash and wound.  Neurological: Negative for speech difficulty and headaches.  All other systems reviewed and are negative.    Physical Exam Triage Vital Signs ED Triage Vitals  Enc Vitals Group  BP 12/27/18 1140 (!) 154/92     Pulse Rate 12/27/18 1140 62     Resp 12/27/18 1140 18     Temp 12/27/18 1140 97.8 F (36.6 C)     Temp Source 12/27/18 1140 Temporal     SpO2 12/27/18 1140 96 %     Weight --      Height --      Head Circumference --      Peak Flow --      Pain Score 12/27/18 1141 0     Pain Loc --      Pain Edu? --      Excl. in [place]? --    No data found.  Updated Vital Signs BP (!) 154/92 (BP Location: Left Arm)   Pulse 62   Temp 97.8 F (36.6 C) (Temporal)   Resp  18   SpO2 96%   Visual Acuity Right Eye Distance:   Left Eye Distance:   Bilateral Distance:    Right Eye Near:   Left Eye Near:    Bilateral Near:     Physical Exam Constitutional:      General: He is not in acute distress. HENT:     Head: Normocephalic and atraumatic.     Nose: Nose normal.     Mouth/Throat:     Mouth: Mucous membranes are moist.     Comments: Poor dentition with several caries.  All teeth nontender to palpation.  Patient does have broken tooth, though there is no TTP, gingival edema, pallor, injection, fluctuance, discharge. Eyes:     General: No scleral icterus.    Pupils: Pupils are equal, round, and reactive to light.  Cardiovascular:     Rate and Rhythm: Normal rate and regular rhythm.  Pulmonary:     Effort: Pulmonary effort is normal. No respiratory distress.     Breath sounds: No wheezing.  Skin:      Neurological:     Mental Status: He is alert and oriented to person, place, and time.      UC Treatments / Results  Labs (all labs ordered are listed, but only abnormal results are displayed) Labs Reviewed - No data to display  EKG    Radiology No results found.  Procedures Procedures (including critical care time)  Medications Ordered in UC Medications - No data to display  Initial Impression / Assessment and Plan / UC Course  I have reviewed the triage vital signs and the nursing notes.  Pertinent labs & imaging results that were available during my care of the patient were reviewed by me and considered in my medical decision making (see chart for details).     1.  Facial swelling No active sign of infection, the patient does have poor dentition.  Patient has dentist, who is providing routine care.  Reviewed signs/symptoms of infection.  We will treat conservatively as outlined below for now.  Return precautions discussed, patient verbalized understanding and is agreeable to plan.  2.  Essential hypertension Patient brought  note with current doses: States he does not miss doses.  Refill sent for 1 month.  Patient to establish care with PCP for additional refills. Final Clinical Impressions(s) / UC Diagnoses   Final diagnoses:  Essential hypertension  Left facial swelling     Discharge Instructions     Cold therapy (ice packs) can be used to help swelling both after injury and after prolonged use of areas of chronic pain/aches.  For pain: recommend 350 mg-1000 mg  of Tylenol (acetaminophen) and/or 200 mg - 800 mg of Advil (ibuprofen, Motrin) every 8 hours as needed.  May alternate between the two throughout the day as they are generally safe to take together.  DO NOT exceed more than 3000 mg of Tylenol or 3200 mg of ibuprofen in a 24 hour period as this could damage your stomach, kidneys, liver, or increase your bleeding risk.   Return for worsening swelling, redness, heat, pain, pain with chewing, or fever.    ED Prescriptions    Medication Sig Dispense Auth. Provider   lisinopril (ZESTRIL) 10 MG tablet Take 4 tablets (40 mg total) by mouth daily. 30 tablet Hall-Potvin, [place], PA-C   amLODipine (NORVASC) 10 MG tablet Take 1 tablet (10 mg total) by mouth daily. 30 tablet Hall-Potvin, [place], PA-C   hydrochlorothiazide (HYDRODIURIL) 25 MG tablet Take 1 tablet (25 mg total) by mouth daily. 30 tablet Hall-Potvin, [place], PA-C     PDMP not reviewed this encounter.   Hall-Potvin, [place], [place] 12/27/18 1238

## 2018-12-27 NOTE — ED Triage Notes (Addendum)
Pt presents to [facility] for assessment of left facial swelling over his left lip since this morning.  Patient states he also noticed swelling on his gums to that same area.     Patient states he was using [facility] as his PCP and needs refills on his medicines for BP (not completely out of everything yet).

## 2018-12-27 NOTE — Discharge Instructions (Addendum)
Cold therapy (ice packs) can be used to help swelling both after injury and after prolonged use of areas of chronic pain/aches.  For pain: recommend 350 mg-1000 mg of Tylenol (acetaminophen) and/or 200 mg - 800 mg of Advil (ibuprofen, Motrin) every 8 hours as needed.  May alternate between the two throughout the day as they are generally safe to take together.  DO NOT exceed more than 3000 mg of Tylenol or 3200 mg of ibuprofen in a 24 hour period as this could damage your stomach, kidneys, liver, or increase your bleeding risk.   Return for worsening swelling, redness, heat, pain, pain with chewing, or fever.

## 2018-12-30 DIAGNOSIS — C01 Malignant neoplasm of base of tongue: Secondary | ICD-10-CM | POA: Diagnosis not present

## 2018-12-30 DIAGNOSIS — E876 Hypokalemia: Secondary | ICD-10-CM | POA: Diagnosis not present

## 2018-12-30 DIAGNOSIS — Z85810 Personal history of malignant neoplasm of tongue: Secondary | ICD-10-CM | POA: Diagnosis not present

## 2019-02-19 ENCOUNTER — Ambulatory Visit (INDEPENDENT_AMBULATORY_CARE_PROVIDER_SITE_OTHER): Payer: BC Managed Care – PPO | Admitting: Nurse Practitioner

## 2019-02-19 DIAGNOSIS — Z131 Encounter for screening for diabetes mellitus: Secondary | ICD-10-CM

## 2019-02-19 DIAGNOSIS — Z1211 Encounter for screening for malignant neoplasm of colon: Secondary | ICD-10-CM

## 2019-02-19 DIAGNOSIS — I10 Essential (primary) hypertension: Secondary | ICD-10-CM | POA: Diagnosis not present

## 2019-02-19 DIAGNOSIS — E785 Hyperlipidemia, unspecified: Secondary | ICD-10-CM | POA: Diagnosis not present

## 2019-02-19 DIAGNOSIS — Z1159 Encounter for screening for other viral diseases: Secondary | ICD-10-CM

## 2019-02-19 DIAGNOSIS — Z8547 Personal history of malignant neoplasm of testis: Secondary | ICD-10-CM

## 2019-02-19 DIAGNOSIS — Z114 Encounter for screening for human immunodeficiency virus [HIV]: Secondary | ICD-10-CM

## 2019-02-19 DIAGNOSIS — Z130 Encounter for screening for diseases of the blood and blood-forming organs and certain disorders involving the immune mechanism: Secondary | ICD-10-CM

## 2019-02-19 DIAGNOSIS — Z125 Encounter for screening for malignant neoplasm of prostate: Secondary | ICD-10-CM

## 2019-02-19 DIAGNOSIS — Z13228 Encounter for screening for other metabolic disorders: Secondary | ICD-10-CM

## 2019-02-19 MED ORDER — AMLODIPINE BESYLATE 10 MG PO TABS: 10.0000 mg | ORAL_TABLET | Freq: Every day | ORAL | 1 refills | Status: DC

## 2019-02-19 MED ORDER — LISINOPRIL 10 MG PO TABS: 10.0000 mg | ORAL_TABLET | Freq: Every day | ORAL | 1 refills | Status: DC

## 2019-02-19 MED ORDER — ATORVASTATIN CALCIUM 20 MG PO TABS: 20.0000 mg | ORAL_TABLET | Freq: Every day | ORAL | 2 refills | Status: DC

## 2019-02-19 MED ORDER — HYDROCHLOROTHIAZIDE 25 MG PO TABS: 25.0000 mg | ORAL_TABLET | Freq: Every day | ORAL | 0 refills | Status: DC

## 2019-02-19 NOTE — Progress Notes (Signed)
Virtual Visit via Telephone Note Due to national recommendations of social distancing due to COVID 19, telehealth visit is felt to be most appropriate for this patient at this time.  I discussed the limitations, risks, security and privacy concerns of performing an evaluation and management service by telephone and the availability of in person appointments. I also discussed with the patient that there may be a patient responsible charge related to this service. The patient expressed understanding and agreed to proceed.    I connected with Matthew Hatfield on 02/19/19  at   9:30 AM EST  EDT by telephone and verified that I am speaking with the correct person using two identifiers.   Consent I discussed the limitations, risks, security and privacy concerns of performing an evaluation and management service by telephone and the availability of in person appointments. I also discussed with the patient that there may be a patient responsible charge related to this service. The patient expressed understanding and agreed to proceed.   Location of Patient: Private Residence   Location of Provider: [facility]    Persons participating in Telemedicine visit: [name] FNP-BC YY Bien CMA Sargon Elena    History of Present Illness: Telemedicine visit for: Establish  has a past medical history of Cancer of base of tongue (HCC), GERD (gastroesophageal reflux disease), Hyperlipidemia, Hypertension, and Personal history of testicular cancer.   He sees a Hematologist at [facility] every 6 months. He has a history of testicular cancer 2008 and throat cancer 2019. S/P Chemo and radiation with treatments complete.   Essential Hypertension He monitors his blood pressure at home with average readings: 110-130/80s. He is currently only taking lisinopril 10 mg instead of 40 mg. I will not increase his blood pressure medication since his readings at home are  normal. He is also taking amlodipine 10 mg, HCTZ 25 mg.  BP Readings from Last 3 Encounters:  12/27/18 (!) 154/92  03/06/17 (!) 163/91  03/04/16 167/96      Past Medical History:  Diagnosis Date  . Cancer of base of tongue (HCC)   . GERD (gastroesophageal reflux disease)   . Hyperlipidemia   . Hypertension   . Personal history of testicular cancer     Past Surgical History:  Procedure Laterality Date  . CHOLECYSTECTOMY    . HERNIA REPAIR    . KNEE SURGERY    . RADICAL ORCHIECTOMY Right   . TONSILLECTOMY      Family History  Problem Relation Age of Onset  . Cancer Mother   . Heart disease Mother   . Lung cancer Father   . Diabetes Maternal Uncle     Social History   Socioeconomic History  . Marital status: Married    Spouse name: Not on file  . Number of children: Not on file  . Years of education: Not on file  . Highest education level: Not on file  Occupational History  . Not on file  Tobacco Use  . Smoking status: Never Smoker  . Smokeless tobacco: Never Used  Substance and Sexual Activity  . Alcohol use: No    Alcohol/week: 0.0 standard drinks  . Drug use: No  . Sexual activity: Not on file  Other Topics Concern  . Not on file  Social History Narrative  . Not on file   Social Determinants of Health   Financial Resource Strain:   . Difficulty of Paying Living Expenses: Not on file  Food Insecurity:   .   Worried About Running Out of Food in the Last Year: Not on file  . Ran Out of Food in the Last Year: Not on file  Transportation Needs:   . Lack of Transportation (Medical): Not on file  . Lack of Transportation (Non-Medical): Not on file  Physical Activity:   . Days of Exercise per Week: Not on file  . Minutes of Exercise per Session: Not on file  Stress:   . Feeling of Stress : Not on file  Social Connections:   . Frequency of Communication with Friends and Family: Not on file  . Frequency of Social Gatherings with Friends and Family: Not on  file  . Attends Religious Services: Not on file  . Active Member of Clubs or Organizations: Not on file  . Attends Club or Organization Meetings: Not on file  . Marital Status: Not on file     Observations/Objective: Awake, alert and oriented x 3   Review of Systems  Constitutional: Negative for fever, malaise/fatigue and weight loss.  HENT: Negative.  Negative for nosebleeds.   Eyes: Negative.  Negative for blurred vision, double vision and photophobia.  Respiratory: Negative.  Negative for cough and shortness of breath.   Cardiovascular: Negative.  Negative for chest pain, palpitations and leg swelling.  Gastrointestinal: Positive for constipation. Negative for heartburn, nausea and vomiting.  Musculoskeletal: Negative.  Negative for myalgias.  Neurological: Negative.  Negative for dizziness, focal weakness, seizures and headaches.  Psychiatric/Behavioral: Negative.  Negative for suicidal ideas.    Assessment and Plan: Matthew Hatfield was seen today for establish care, hypertension and hyperlipidemia.  Diagnoses and all orders for this visit:  Essential hypertension -     amLODipine (NORVASC) 10 MG tablet; Take 1 tablet (10 mg total) by mouth daily. -     hydrochlorothiazide (HYDRODIURIL) 25 MG tablet; Take 1 tablet (25 mg total) by mouth daily. -     lisinopril (ZESTRIL) 10 MG tablet; Take 1 tablet (10 mg total) by mouth daily. -     CMP14+EGFR; Future  Need for hepatitis C screening test -     Hepatitis C Antibody; Future  Encounter for screening for HIV -     HIV antibody (with reflex); Future  Colon cancer screening -     Ambulatory referral to Gastroenterology  Encounter for screening for diabetes mellitus -     A1c; Future  Screening for deficiency anemia -     CBC; Future  Screening for metabolic disorder -     CMP14+EGFR; Future  Dyslipidemia -     atorvastatin (LIPITOR) 20 MG tablet; Take 1 tablet (20 mg total) by mouth daily. -     Lipid Panel;  Future  Prostate cancer screening -     PSA; Future  History of testicular cancer -     PSA; Future     Follow Up Instructions Return in about 3 months (around 05/20/2019) for HTN.     I discussed the assessment and treatment plan with the patient. The patient was provided an opportunity to ask questions and all were answered. The patient agreed with the plan and demonstrated an understanding of the instructions.   The patient was advised to call back or seek an in-person evaluation if the symptoms worsen or if the condition fails to improve as anticipated.  I provided 19 minutes of non-face-to-face time during this encounter including median intraservice time, reviewing previous notes, labs, imaging, medications and explaining diagnosis and management.  Zelda W Fleming, FNP-BC 

## 2019-02-24 ENCOUNTER — Other Ambulatory Visit: Payer: BC Managed Care – PPO

## 2019-02-24 DIAGNOSIS — E785 Hyperlipidemia, unspecified: Secondary | ICD-10-CM

## 2019-02-24 DIAGNOSIS — Z131 Encounter for screening for diabetes mellitus: Secondary | ICD-10-CM | POA: Diagnosis not present

## 2019-02-24 DIAGNOSIS — Z1159 Encounter for screening for other viral diseases: Secondary | ICD-10-CM

## 2019-02-24 DIAGNOSIS — Z13228 Encounter for screening for other metabolic disorders: Secondary | ICD-10-CM | POA: Diagnosis not present

## 2019-02-24 DIAGNOSIS — Z8547 Personal history of malignant neoplasm of testis: Secondary | ICD-10-CM

## 2019-02-24 DIAGNOSIS — Z125 Encounter for screening for malignant neoplasm of prostate: Secondary | ICD-10-CM | POA: Diagnosis not present

## 2019-02-24 DIAGNOSIS — I10 Essential (primary) hypertension: Secondary | ICD-10-CM

## 2019-02-24 DIAGNOSIS — Z114 Encounter for screening for human immunodeficiency virus [HIV]: Secondary | ICD-10-CM

## 2019-02-24 DIAGNOSIS — Z130 Encounter for screening for diseases of the blood and blood-forming organs and certain disorders involving the immune mechanism: Secondary | ICD-10-CM

## 2019-02-24 NOTE — Progress Notes (Signed)
Patient here for labs ordered during telehealth visit.

## 2019-02-25 LAB — CMP14+EGFR
ALT: 10 IU/L (ref 0–44)
AST: 11 IU/L (ref 0–40)
Albumin/Globulin Ratio: 1.9 (ref 1.2–2.2)
Albumin: 4.5 g/dL (ref 3.8–4.9)
Alkaline Phosphatase: 65 IU/L (ref 39–117)
BUN/Creatinine Ratio: 18 (ref 9–20)
BUN: 16 mg/dL (ref 6–24)
Bilirubin Total: 0.3 mg/dL (ref 0.0–1.2)
CO2: 29 mmol/L (ref 20–29)
Calcium: 9.3 mg/dL (ref 8.7–10.2)
Chloride: 101 mmol/L (ref 96–106)
Creatinine, Ser: 0.88 mg/dL (ref 0.76–1.27)
GFR calc Af Amer: 112 mL/min/{1.73_m2} (ref >59)
GFR calc non Af Amer: 97 mL/min/{1.73_m2} (ref >59)
Globulin, Total: 2.4 g/dL (ref 1.5–4.5)
Glucose: 109 mg/dL — ABNORMAL HIGH (ref 65–99)
Potassium: 3.8 mmol/L (ref 3.5–5.2)
Sodium: 144 mmol/L (ref 134–144)
Total Protein: 6.9 g/dL (ref 6.0–8.5)

## 2019-02-25 LAB — CBC
Hematocrit: 41.6 % (ref 37.5–51.0)
Hemoglobin: 13.7 g/dL (ref 13.0–17.7)
MCH: 28.1 pg (ref 26.6–33.0)
MCHC: 32.9 g/dL (ref 31.5–35.7)
MCV: 85 fL (ref 79–97)
Platelets: 231 10*3/uL (ref 150–450)
RBC: 4.87 x10E6/uL (ref 4.14–5.80)
RDW: 13.9 % (ref 11.6–15.4)
WBC: 5.8 10*3/uL (ref 3.4–10.8)

## 2019-02-25 LAB — HEMOGLOBIN A1C
Est. average glucose Bld gHb Est-mCnc: 120 mg/dL
Hgb A1c MFr Bld: 5.8 % — ABNORMAL HIGH (ref 4.8–5.6)

## 2019-02-25 LAB — HIV ANTIBODY (ROUTINE TESTING W REFLEX): HIV Screen 4th Generation wRfx: NONREACTIVE

## 2019-02-25 LAB — LIPID PANEL
Chol/HDL Ratio: 4.9 ratio (ref 0.0–5.0)
Cholesterol, Total: 185 mg/dL (ref 100–199)
HDL: 38 mg/dL — ABNORMAL LOW (ref >39)
LDL Chol Calc (NIH): 116 mg/dL — ABNORMAL HIGH (ref 0–99)
Triglycerides: 177 mg/dL — ABNORMAL HIGH (ref 0–149)
VLDL Cholesterol Cal: 31 mg/dL (ref 5–40)

## 2019-02-25 LAB — HEPATITIS C ANTIBODY: Hep C Virus Ab: 0.1 s/co ratio (ref 0.0–0.9)

## 2019-02-25 LAB — PSA: Prostate Specific Ag, Serum: 0.6 ng/mL (ref 0.0–4.0)

## 2019-02-28 NOTE — Progress Notes (Signed)
Patient notified of results & recommendations. Expressed understanding.  Made follow up appointment on 05/26/2019 @ 8:30 AM

## 2019-05-02 ENCOUNTER — Encounter: Payer: Self-pay | Admitting: Nurse Practitioner

## 2019-05-14 ENCOUNTER — Other Ambulatory Visit: Payer: Self-pay | Admitting: Nurse Practitioner

## 2019-05-14 DIAGNOSIS — I10 Essential (primary) hypertension: Secondary | ICD-10-CM

## 2019-05-23 ENCOUNTER — Telehealth: Payer: Self-pay

## 2019-05-23 NOTE — Telephone Encounter (Signed)
Called patient to do their pre-visit COVID screening.  Call went to voicemail. Unable to do prescreening.  

## 2019-05-23 NOTE — Patient Instructions (Signed)
Thank you for choosing Primary Care at [facility] to be your medical home!    Matthew Hatfield was seen by Melina Schools, DO today.   Matthew Hatfield primary care provider is Phill Myron, DO.   For the best care possible, you should try to see Phill Myron, DO whenever you come to the clinic.   We look forward to seeing you again soon!  If you have any questions about your visit today, please call us at [phone] or feel free to reach your primary care provider via [place].

## 2019-05-26 ENCOUNTER — Telehealth (INDEPENDENT_AMBULATORY_CARE_PROVIDER_SITE_OTHER): Payer: BC Managed Care – PPO | Admitting: Internal Medicine

## 2019-05-26 ENCOUNTER — Encounter: Payer: Self-pay | Admitting: Internal Medicine

## 2019-05-26 DIAGNOSIS — E785 Hyperlipidemia, unspecified: Secondary | ICD-10-CM | POA: Diagnosis not present

## 2019-05-26 DIAGNOSIS — I10 Essential (primary) hypertension: Secondary | ICD-10-CM | POA: Diagnosis not present

## 2019-05-26 DIAGNOSIS — R7303 Prediabetes: Secondary | ICD-10-CM

## 2019-05-26 DIAGNOSIS — Z1211 Encounter for screening for malignant neoplasm of colon: Secondary | ICD-10-CM

## 2019-05-26 NOTE — Progress Notes (Signed)
Virtual Visit via Telephone Note  I connected with Matthew Hatfield, on 05/26/2019 at 8:37 AM by telephone due to the COVID-19 pandemic and verified that I am speaking with the correct person using two identifiers.   Consent: I discussed the limitations, risks, security and privacy concerns of performing an evaluation and management service by telephone and the availability of in person appointments. I also discussed with the patient that there may be a patient responsible charge related to this service. The patient expressed understanding and agreed to proceed.   Location of Patient: Home  Location of Provider: Home    Persons participating in Telemedicine visit: Otoniel Llan[facility] Dr. Juleen China      History of Present Illness: Patient has a f/u for chronic medical conditions.   Chronic HTN Disease Monitoring:  Home BP Monitoring - Monitors at home occasionally. 146/86 last week. Was previously 128/82 on the last check.  Chest pain- no  Dyspnea- no Headache - no  Medications: Amlodipine 10 mg, HCTZ 25 mg, Lisinopril 10 mg  Compliance- yes Lightheadedness- no  Edema- no    Past Medical History:  Diagnosis Date  . Cancer of base of tongue ([place])   . GERD (gastroesophageal reflux disease)   . Hyperlipidemia   . Hypertension   . Personal history of testicular cancer    Allergies  Allergen Reactions  . Lidocaine Swelling    Pt has used magic mouthwash and viscous lidocaine with oral swelling  . Shellfish-Derived Products Hives  . Antihistamines, Diphenhydramine-Type Palpitations    Current Outpatient Medications on File Prior to Visit  Medication Sig Dispense Refill  . amLODipine (NORVASC) 10 MG tablet Take 1 tablet (10 mg total) by mouth daily. 90 tablet 1  . aspirin 81 MG chewable tablet Chew 81 mg by mouth.    Marland Kitchen atorvastatin (LIPITOR) 20 MG tablet Take 1 tablet (20 mg total) by mouth daily. 90 tablet 2  . hydrochlorothiazide  (HYDRODIURIL) 25 MG tablet TAKE 1 TABLET BY MOUTH EVERY DAY 90 tablet 0  . lisinopril (ZESTRIL) 10 MG tablet Take 1 tablet (10 mg total) by mouth daily. 90 tablet 1   No current facility-administered medications on file prior to visit.    Observations/Objective: NAD. Speaking clearly.  Work of breathing normal.  Alert and oriented. Mood appropriate.   Assessment and Plan: 1. Essential hypertension BPs sound at goal or close to goal. Will continue to have patient monitor at home. Continue current therapy.  Counseled on blood pressure goal of less than 130/80, low-sodium, DASH diet, medication compliance, 150 minutes of moderate intensity exercise per week. Discussed medication compliance, adverse effects.  2. Dyslipidemia Last LDL slightly elevated at 116 in Jan 2021. Continue Lipitor and ASA therapy. Will plan to repeat LDL at next visit to see if Lipitor dose should be adjusted.   3. Colon cancer screening - Ambulatory referral to Gastroenterology  4. Prediabetes Last A1c 5.8% in Jan 2021. Plan to repeat 3 months from now for screening. Discussed diet and exercise to avoid progression to DM.    Follow Up Instructions: 3 month f/u chronic medical conditions    I discussed the assessment and treatment plan with the patient. The patient was provided an opportunity to ask questions and all were answered. The patient agreed with the plan and demonstrated an understanding of the instructions.   The patient was advised to call back or seek an in-person evaluation if the symptoms worsen or if the condition fails to improve as anticipated.  I provided 16 minutes total of non-face-to-face time during this encounter including median intraservice time, reviewing previous notes, investigations, ordering medications, medical decision making, coordinating care and patient verbalized understanding at the end of the visit.    Phill Myron, D.O. Primary Care at [facility]   05/26/2019, 8:37 AM

## 2019-07-21 DIAGNOSIS — C01 Malignant neoplasm of base of tongue: Secondary | ICD-10-CM | POA: Diagnosis not present

## 2019-07-21 DIAGNOSIS — Z79899 Other long term (current) drug therapy: Secondary | ICD-10-CM | POA: Diagnosis not present

## 2019-07-21 DIAGNOSIS — Z8547 Personal history of malignant neoplasm of testis: Secondary | ICD-10-CM | POA: Diagnosis not present

## 2019-08-18 ENCOUNTER — Other Ambulatory Visit: Payer: Self-pay | Admitting: Nurse Practitioner

## 2019-08-18 DIAGNOSIS — I10 Essential (primary) hypertension: Secondary | ICD-10-CM

## 2019-08-19 NOTE — Telephone Encounter (Signed)
Requested medication (s) are due for refill today:   Yes  Requested medication (s) are on the active medication list:   Yes  Future visit scheduled:   No   Last ordered: 02/19/2019 #90 with 1 refill by zelda Raul Del at [facility] at [facility].  Returned because pt was seen at Primary Care at [facility] by Dr. Phill Myron and Geryl Rankins where this medication was prescribed.    Requested Prescriptions  Pending Prescriptions Disp Refills   lisinopril (ZESTRIL) 10 MG tablet [Pharmacy Med Name: LISINOPRIL 10 MG TABLET] 90 tablet 1    Sig: TAKE 1 TABLET BY MOUTH EVERY DAY      There is no refill protocol information for this order

## 2019-09-23 ENCOUNTER — Other Ambulatory Visit: Payer: Self-pay | Admitting: Nurse Practitioner

## 2019-09-23 DIAGNOSIS — I10 Essential (primary) hypertension: Secondary | ICD-10-CM

## 2019-09-23 MED ORDER — LISINOPRIL 10 MG PO TABS: 10.0000 mg | ORAL_TABLET | Freq: Every day | ORAL | 0 refills | Status: DC

## 2019-09-23 MED ORDER — HYDROCHLOROTHIAZIDE 25 MG PO TABS: 25.0000 mg | ORAL_TABLET | Freq: Every day | ORAL | 0 refills | Status: DC

## 2019-09-23 MED ORDER — AMLODIPINE BESYLATE 10 MG PO TABS: 10.0000 mg | ORAL_TABLET | Freq: Every day | ORAL | 0 refills | Status: DC

## 2019-09-23 NOTE — Telephone Encounter (Signed)
amLODipine (NORVASC) 10 MG tablet [[id number]]  [id number] - [place], [place] [address] [place] [address]

## 2019-12-23 ENCOUNTER — Encounter: Payer: Self-pay | Admitting: Internal Medicine

## 2019-12-23 ENCOUNTER — Ambulatory Visit (INDEPENDENT_AMBULATORY_CARE_PROVIDER_SITE_OTHER): Payer: BC Managed Care – PPO | Admitting: Internal Medicine

## 2019-12-23 ENCOUNTER — Other Ambulatory Visit: Payer: Self-pay

## 2019-12-23 VITALS — BP 144/82 | HR 58 | Temp 97.3°F | Resp 17 | Ht 67.0 in | Wt 241.0 lb

## 2019-12-23 DIAGNOSIS — E785 Hyperlipidemia, unspecified: Secondary | ICD-10-CM

## 2019-12-23 DIAGNOSIS — Z2821 Immunization not carried out because of patient refusal: Secondary | ICD-10-CM

## 2019-12-23 DIAGNOSIS — I10 Essential (primary) hypertension: Secondary | ICD-10-CM

## 2019-12-23 MED ORDER — LISINOPRIL 10 MG PO TABS: 10.0000 mg | ORAL_TABLET | Freq: Every day | ORAL | 0 refills | Status: DC

## 2019-12-23 MED ORDER — HYDROCHLOROTHIAZIDE 25 MG PO TABS: 25.0000 mg | ORAL_TABLET | Freq: Every day | ORAL | 0 refills | Status: DC

## 2019-12-23 MED ORDER — AMLODIPINE BESYLATE 10 MG PO TABS: 10.0000 mg | ORAL_TABLET | Freq: Every day | ORAL | 0 refills | Status: DC

## 2019-12-23 NOTE — Progress Notes (Signed)
  Subjective:    Matthew Hatfield - 54 y.o. male MRN [id number]  Date of birth: [birth year]  HPI  Matthew Hatfield is here for HTN f/u.  Chronic HTN Disease Monitoring:  Home BP Monitoring - 120-130s/80s  Chest pain- no  Dyspnea- no Headache - no  Medications: Amlodipine 10 mg, HCTZ 25 mg, Lisinopril 10 mg  Compliance- yes, but did run out of HCTZ 2 days ago  Lightheadedness- no  Edema- no    Health Maintenance:  Health Maintenance Due  Topic Date Due  . COVID-19 Vaccine (1) Never done  . COLONOSCOPY  Never done    -  reports that he has never smoked. He has never used smokeless tobacco. - Review of Systems: Per HPI. - Past Medical History: Patient Active Problem List   Diagnosis Date Noted  . Testicular cancer ([place]) 01/26/2015  . Essential hypertension 08/19/2013   - Medications: reviewed and updated   Objective:   Physical Exam BP (!) 144/82   Pulse (!) 58   Temp (!) 97.3 F (36.3 C) (Temporal)   Resp 17   Ht 5\' 7"  (1.702 m)   Wt 241 lb (109.3 kg)   SpO2 98%   BMI 37.75 kg/m  Physical Exam Constitutional:      General: He is not in acute distress.    Appearance: He is not diaphoretic.  HENT:     Head: Normocephalic and atraumatic.  Eyes:     Conjunctiva/sclera: Conjunctivae normal.  Cardiovascular:     Rate and Rhythm: Normal rate and regular rhythm.     Heart sounds: Normal heart sounds. No murmur heard.   Pulmonary:     Effort: Pulmonary effort is normal. No respiratory distress.     Breath sounds: Normal breath sounds.  Musculoskeletal:        General: Normal range of motion.  Skin:    General: Skin is warm and dry.  Neurological:     Mental Status: He is alert and oriented to person, place, and time.  Psychiatric:        Mood and Affect: Affect normal.        Judgment: Judgment normal.            Assessment & Plan:   1. Essential hypertension BP slightly above goal; however, patient has been out of HCTZ for a few  days and has better control with home readings. Continue current regimen. Lab monitoring due to medications needed.  Counseled on blood pressure goal of less than 130/80, low-sodium, DASH diet, medication compliance, 150 minutes of moderate intensity exercise per week. Discussed medication compliance, adverse effects. - Lipid Panel - Comprehensive metabolic panel - amLODipine (NORVASC) 10 MG tablet; Take 1 tablet (10 mg total) by mouth daily.  Dispense: 90 tablet; Refill: 0 - hydrochlorothiazide (HYDRODIURIL) 25 MG tablet; Take 1 tablet (25 mg total) by mouth daily.  Dispense: 90 tablet; Refill: 0 - lisinopril (ZESTRIL) 10 MG tablet; Take 1 tablet (10 mg total) by mouth daily.  Dispense: 90 tablet; Refill: 0  2. Dyslipidemia On Lipitor 20 mg.  - Lipid Panel  3. Influenza vaccination declined     Phill Myron, D.O. 12/23/2019, 8:46 AM Primary Care at [facility]

## 2019-12-24 ENCOUNTER — Telehealth (INDEPENDENT_AMBULATORY_CARE_PROVIDER_SITE_OTHER): Payer: Self-pay

## 2019-12-24 ENCOUNTER — Other Ambulatory Visit: Payer: Self-pay | Admitting: Internal Medicine

## 2019-12-24 DIAGNOSIS — E785 Hyperlipidemia, unspecified: Secondary | ICD-10-CM

## 2019-12-24 LAB — LIPID PANEL
Chol/HDL Ratio: 5.5 ratio — ABNORMAL HIGH (ref 0.0–5.0)
Cholesterol, Total: 188 mg/dL (ref 100–199)
HDL: 34 mg/dL — ABNORMAL LOW (ref >39)
LDL Chol Calc (NIH): 118 mg/dL — ABNORMAL HIGH (ref 0–99)
Triglycerides: 204 mg/dL — ABNORMAL HIGH (ref 0–149)
VLDL Cholesterol Cal: 36 mg/dL (ref 5–40)

## 2019-12-24 LAB — COMPREHENSIVE METABOLIC PANEL WITH GFR
ALT: 10 [IU]/L (ref 0–44)
AST: 8 [IU]/L (ref 0–40)
Albumin/Globulin Ratio: 1.8 (ref 1.2–2.2)
Albumin: 4.4 g/dL (ref 3.8–4.9)
Alkaline Phosphatase: 74 [IU]/L (ref 44–121)
BUN/Creatinine Ratio: 16 (ref 9–20)
BUN: 13 mg/dL (ref 6–24)
Bilirubin Total: 0.2 mg/dL (ref 0.0–1.2)
CO2: 29 mmol/L (ref 20–29)
Calcium: 8.9 mg/dL (ref 8.7–10.2)
Chloride: 99 mmol/L (ref 96–106)
Creatinine, Ser: 0.82 mg/dL (ref 0.76–1.27)
GFR calc Af Amer: 114 mL/min/{1.73_m2}
GFR calc non Af Amer: 99 mL/min/{1.73_m2}
Globulin, Total: 2.4 g/dL (ref 1.5–4.5)
Glucose: 102 mg/dL — ABNORMAL HIGH (ref 65–99)
Potassium: 4.4 mmol/L (ref 3.5–5.2)
Sodium: 140 mmol/L (ref 134–144)
Total Protein: 6.8 g/dL (ref 6.0–8.5)

## 2019-12-24 MED ORDER — ATORVASTATIN CALCIUM 40 MG PO TABS: 40.0000 mg | ORAL_TABLET | Freq: Every day | ORAL | 3 refills | Status: DC

## 2019-12-24 NOTE — Telephone Encounter (Signed)
-----   Message from Nicolette Bang, DO sent at 12/24/2019  9:48 AM EST ----- Please call Mertha Finders about his results. CMET normal. Lipid panel shows elevated total cholesterol and LDL. Would recommend increasing Lipitor to 40 mg daily. New Rx sent. Would recommend aiming for 150 minutes of cardiac exercise per week. Advise to decrease animal fat intake, saturated fats, processed foods. Increase fiber rich foods such as fruits and veggies.     The 10-year ASCVD risk score Mikey Bussing DC Brooke Bonito., et al., 2013) is: 11.3%   Values used to calculate the score:     Age: 54 years     Sex: Male     Is Non-Hispanic African American: No     Diabetic: No     Tobacco smoker: No     Systolic Blood Pressure: 446 mmHg     Is BP treated: Yes     HDL Cholesterol: 34 mg/dL     Total Cholesterol: 188 mg/dL   Thanks,  Phill Myron, D.O. Primary Care at [facility]  12/24/2019, 9:46 AM

## 2019-12-24 NOTE — Telephone Encounter (Signed)
Patient verified date of birth. He is aware of results per PCP. He is aware that atorvastatin has been increased and new Rx has been sent. He verbalized understanding of results. Nat Christen, CMA

## 2020-01-16 ENCOUNTER — Ambulatory Visit
Admission: EM | Admit: 2020-01-16 | Discharge: 2020-01-16 | Disposition: A | Discharge status: Home / Self Care | Payer: BC Managed Care – PPO | Attending: Family Medicine | Admitting: Family Medicine

## 2020-01-16 DIAGNOSIS — Z20822 Contact with and (suspected) exposure to covid-19: Secondary | ICD-10-CM

## 2020-01-16 NOTE — ED Triage Notes (Signed)
Pt requesting covid testing. Denies any sx's.

## 2020-01-16 NOTE — Discharge Instructions (Signed)

## 2020-01-17 LAB — SARS-COV-2, NAA 2 DAY TAT

## 2020-01-17 LAB — NOVEL CORONAVIRUS, NAA: SARS-CoV-2, NAA: NOT DETECTED

## 2020-01-23 ENCOUNTER — Other Ambulatory Visit: Payer: Self-pay

## 2020-01-23 ENCOUNTER — Ambulatory Visit
Admission: EM | Admit: 2020-01-23 | Discharge: 2020-01-23 | Disposition: A | Discharge status: Home / Self Care | Payer: BC Managed Care – PPO | Attending: Emergency Medicine | Admitting: Emergency Medicine

## 2020-01-23 DIAGNOSIS — R197 Diarrhea, unspecified: Secondary | ICD-10-CM

## 2020-01-23 DIAGNOSIS — F419 Anxiety disorder, unspecified: Secondary | ICD-10-CM

## 2020-01-23 MED ORDER — HYDROXYZINE HCL 25 MG PO TABS: 25.0000 mg | ORAL_TABLET | Freq: Four times a day (QID) | ORAL | 0 refills | Status: DC

## 2020-01-23 NOTE — ED Triage Notes (Signed)
Patient states he needs to check his blood pressure. Pt states his wife passed Tuesday and he has been stressed and feels shaky and wants to make sure his bp is controlled. Pt is aox4 and ambulatory.

## 2020-01-23 NOTE — Discharge Instructions (Signed)
Blood pressure normal, EKG normal Hydroxyzine as needed for anxiety Rest and fluids  Follow up if not improving or worsening

## 2020-01-23 NOTE — ED Provider Notes (Signed)
[facility]    CSN: [id number] Arrival date & time: 01/23/20  1226      History   Chief Complaint Chief Complaint  Patient presents with   Hypertension    HPI Matthew Hatfield is a 54 y.o. male history of hypertension, GERD, hyperlipidemia, esophageal cancer presenting today for evaluation of his blood pressure.  Reports that he had unexpected passing of his wife on Tuesday, 4 days ago.  He reports he has had increased diarrhea over the past few days as well as back tightening.  He more recently felt an all of a sudden overwhelming feeling of tingling sensations in extremities as well as decreased appetite.  He denies any chest pain or shortness of breath.  Denies dizziness or lightheadedness.  Denies vision changes.  He is concerned about possible elevation of blood pressure causing this.  Also reports that he has increased anxiety with the symptoms as well.  Denies history of anxiety.    HPI  Past Medical History:  Diagnosis Date   Cancer of base of tongue ([facility])    GERD (gastroesophageal reflux disease)    Hyperlipidemia    Hypertension    Personal history of testicular cancer     Patient Active Problem List   Diagnosis Date Noted   Testicular cancer ([place]) 01/26/2015   Essential hypertension 08/19/2013    Past Surgical History:  Procedure Laterality Date   CHOLECYSTECTOMY     HERNIA REPAIR     KNEE SURGERY     RADICAL ORCHIECTOMY Right    TONSILLECTOMY         Home Medications    Prior to Admission medications   Medication Sig Start Date End Date Taking? Authorizing Provider  amLODipine (NORVASC) 10 MG tablet Take 1 tablet (10 mg total) by mouth daily. 12/23/19 03/22/20 Yes Nicolette Bang, DO  aspirin 81 MG chewable tablet Chew 81 mg by mouth.   Yes [provider]  atorvastatin (LIPITOR) 40 MG tablet Take 1 tablet (40 mg total) by mouth daily. 12/24/19  Yes Nicolette Bang, DO  hydrochlorothiazide  (HYDRODIURIL) 25 MG tablet Take 1 tablet (25 mg total) by mouth daily. 12/23/19  Yes Nicolette Bang, DO  lisinopril (ZESTRIL) 10 MG tablet Take 1 tablet (10 mg total) by mouth daily. 12/23/19  Yes Nicolette Bang, DO  hydrOXYzine (ATARAX/VISTARIL) 25 MG tablet Take 1 tablet (25 mg total) by mouth every 6 (six) hours. 01/23/20   Ambriel Gorelick, Elesa Hacker, PA-C    Family History Family History  Problem Relation Age of Onset   Cancer Mother    Heart disease Mother    Lung cancer Father    Diabetes Maternal Uncle     Social History Social History   Tobacco Use   Smoking status: Never Smoker   Smokeless tobacco: Never Used  Scientific laboratory technician Use: Never used  Substance Use Topics   Alcohol use: No    Alcohol/week: 0.0 standard drinks   Drug use: No     Allergies   Other; Lidocaine; Shellfish-derived products; and Antihistamines, diphenhydramine-type   Review of Systems Review of Systems  Constitutional: Negative for fatigue and fever.  HENT: Negative for congestion, sinus pressure and sore throat.   Eyes: Negative for photophobia, pain and visual disturbance.  Respiratory: Negative for cough and shortness of breath.   Cardiovascular: Negative for chest pain.  Gastrointestinal: Positive for diarrhea. Negative for abdominal pain, nausea and vomiting.  Genitourinary: Negative for decreased urine volume and hematuria.  Musculoskeletal: Negative for myalgias, neck pain and neck stiffness.  Neurological: Positive for headaches. Negative for dizziness, syncope, facial asymmetry, speech difficulty, weakness, light-headedness and numbness.     Physical Exam Triage Vital Signs ED Triage Vitals  Enc Vitals Group     BP 01/23/20 1245 118/77     Pulse Rate 01/23/20 1245 82     Resp 01/23/20 1245 18     Temp 01/23/20 1245 98 F (36.7 C)     Temp Source 01/23/20 1245 Oral     SpO2 01/23/20 1245 98 %     Weight --      Height --      Head Circumference --       Peak Flow --      Pain Score 01/23/20 1247 0     Pain Loc --      Pain Edu? --      Excl. in [place]? --    No data found.  Updated Vital Signs BP 118/77 (BP Location: Left Arm)    Pulse 82    Temp 98 F (36.7 C) (Oral)    Resp 18    SpO2 98%   Visual Acuity Right Eye Distance:   Left Eye Distance:   Bilateral Distance:    Right Eye Near:   Left Eye Near:    Bilateral Near:     Physical Exam Vitals and nursing note reviewed.  Constitutional:      Appearance: He is well-developed and well-nourished.     Comments: No acute distress  HENT:     Head: Normocephalic and atraumatic.     Nose: Nose normal.  Eyes:     Extraocular Movements: Extraocular movements intact.     Conjunctiva/sclera: Conjunctivae normal.     Pupils: Pupils are equal, round, and reactive to light.  Cardiovascular:     Rate and Rhythm: Normal rate and regular rhythm.  Pulmonary:     Effort: Pulmonary effort is normal. No respiratory distress.     Comments: Breathing comfortably at rest, CTABL, no wheezing, rales or other adventitious sounds auscultated Abdominal:     General: There is no distension.  Musculoskeletal:        General: Normal range of motion.     Cervical back: Neck supple.  Skin:    General: Skin is warm and dry.  Neurological:     General: No focal deficit present.     Mental Status: He is alert and oriented to person, place, and time. Mental status is at baseline.     Cranial Nerves: No cranial nerve deficit.     Motor: No weakness.     Gait: Gait normal.  Psychiatric:        Mood and Affect: Mood and affect normal.      UC Treatments / Results  Labs (all labs ordered are listed, but only abnormal results are displayed) Labs Reviewed - No data to display  EKG   Radiology No results found.  Procedures Procedures (including critical care time)  Medications Ordered in UC Medications - No data to display  Initial Impression / Assessment and Plan / UC Course  I  have reviewed the triage vital signs and the nursing notes.  Pertinent labs & imaging results that were available during my care of the patient were reviewed by me and considered in my medical decision making (see chart for details).     EKG normal sinus rhythm, blood pressure 118/72, vitals and exam reassuring, no neuro deficits.  Suspect most likely anxiety related to recent wife passing.  Will provide hydroxyzine to use as needed continue to rest and fluids.  Diarrhea most likely functional as well and recommended hydration, may try over-the-counter Imodium.  Discussed strict return precautions. Patient verbalized understanding and is agreeable with plan.  Final Clinical Impressions(s) / UC Diagnoses   Final diagnoses:  Anxiety  Diarrhea, unspecified type     Discharge Instructions     Blood pressure normal, EKG normal Hydroxyzine as needed for anxiety Rest and fluids  Follow up if not improving or worsening    ED Prescriptions    Medication Sig Dispense Auth. Provider   hydrOXYzine (ATARAX/VISTARIL) 25 MG tablet Take 1 tablet (25 mg total) by mouth every 6 (six) hours. 16 tablet Kaushik Maul, [place] C, PA-C     PDMP not reviewed this encounter.   Janith Lima, [place] 01/24/20 [phone]

## 2020-02-02 ENCOUNTER — Other Ambulatory Visit: Payer: Self-pay | Admitting: Oncology

## 2020-02-02 DIAGNOSIS — C01 Malignant neoplasm of base of tongue: Secondary | ICD-10-CM

## 2020-02-02 NOTE — Progress Notes (Signed)
[facility]  [address] [place],  [place]  [address] [phone]  Clinic Day:  02/03/2020  Referring physician: Caryl Never*   HISTORY OF PRESENT ILLNESS:  The patient is a 54 y.o. 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  He completed concurrent chemoradiation, which consisted of weekly carboplatin/paclitaxel, in May 2019.  All scans and physical exams since then have shown no signs of disease recurrence.  He comes in today for routine followup.  Since his last visit, the patient has been doing well.  He denies having any new symptoms or findings which concern him for possible disease recurrence.  Of note, he also has a history of seminoma, for which she underwent a orchiectomy in 2008, followed by BEP chemotherapy, which was completed in 2009. Since then, exams have shown no signs of disease recurrence.  He has been considered cured of this disease.    PHYSICAL EXAM:  Blood pressure (!) 149/90, pulse 65, temperature 98.1 F (36.7 C), resp. rate 18, height 5\' 7"  (1.702 m), weight 239 lb 1.6 oz (108.5 kg), SpO2 97 %. Wt Readings from Last 3 Encounters:  02/03/20 239 lb 1.6 oz (108.5 kg)  12/23/19 241 lb (109.3 kg)  02/08/15 258 lb (117 kg)   Body mass index is 37.45 kg/m. Performance status (ECOG): 0 Physical Exam Constitutional:      Appearance: Normal appearance. He is not ill-appearing.  HENT:     Mouth/Throat:     Mouth: Mucous membranes are moist.     Pharynx: Oropharynx is clear. No oropharyngeal exudate or posterior oropharyngeal erythema.  Cardiovascular:     Rate and Rhythm: Normal rate and regular rhythm.     Heart sounds: No murmur heard. No friction rub. No gallop.   Pulmonary:     Effort: Pulmonary effort is normal. No respiratory distress.     Breath sounds: Normal breath sounds. No wheezing, rhonchi or rales.  Chest:  Breasts:     Right: No axillary  adenopathy or supraclavicular adenopathy.     Left: No axillary adenopathy or supraclavicular adenopathy.    Abdominal:     General: Bowel sounds are normal. There is no distension.     Palpations: Abdomen is soft. There is no mass.     Tenderness: There is no abdominal tenderness.  Musculoskeletal:        General: No swelling.     Right lower leg: No edema.     Left lower leg: No edema.  Lymphadenopathy:     Cervical: No cervical adenopathy.     Upper Body:     Right upper body: No supraclavicular or axillary adenopathy.     Left upper body: No supraclavicular or axillary adenopathy.     Lower Body: No right inguinal adenopathy. No left inguinal adenopathy.  Skin:    General: Skin is warm.     Coloration: Skin is not jaundiced.     Findings: No lesion or rash.  Neurological:     General: No focal deficit present.     Mental Status: He is alert and oriented to person, place, and time. Mental status is at baseline.     Cranial Nerves: Cranial nerves are intact.  Psychiatric:        Mood and Affect: Mood normal.        Behavior: Behavior normal.        Thought Content: Thought content normal.  LABS:   CBC Latest Ref Rng & Units 02/03/2020 02/24/2019  WBC - 4.9 5.8  Hemoglobin 13.5 - 17.5 10.5(A) 13.7  Hematocrit 41 - 53 33(A) 41.6  Platelets 150 - 399 325 231   CMP Latest Ref Rng & Units 02/03/2020 12/23/2019 02/24/2019  Glucose 65 - 99 mg/dL - 102(H) 109(H)  BUN 4 - 21 16 13 16   Creatinine 0.6 - 1.3 0.8 0.82 0.88  Sodium 137 - 147 140 140 144  Potassium 3.4 - 5.3 3.8 4.4 3.8  Chloride 99 - 108 102 99 101  CO2 13 - 22 31(A) 29 29  Calcium 8.7 - 10.7 9.0 8.9 9.3  Total Protein 6.0 - 8.5 g/dL - 6.8 6.9  Total Bilirubin 0.0 - 1.2 mg/dL - 0.2 0.3  Alkaline Phos 25 - 125 60 74 65  AST 14 - 40 25 8 11   ALT 10 - 40 15 10 10      ASSESSMENT & PLAN:  Assessment/Plan:  A 54 y.o. male with stage I (T2 N1 M0) HPV positive squamous cell carcinoma of the base of his right  tongue, status post definitive chemoradiation in May 2019.  There remains nothing per his physical exam today to suggest any evidence of disease recurrence.  On another note, this gentleman's hemoglobin and MCV have fallen precipitously over the past few months.  His iron parameters today are clearly consistent with him iron deficiency anemia.  Based upon this, I will arrange for him to receive IV Feraheme 1020 mg over these next few weeks to replenish his iron stores and normalize his hemoglobin.  Furthermore, I will set him up for a GI workup in the forthcoming months as he has not had a colonoscopy within at least the past 5 years.   I will see him back in 3 months for repeat clinical assessment.  The patient understands all the plans discussed today and is in agreement with them.     Malkia Nippert Macarthur Critchley, MD

## 2020-02-03 ENCOUNTER — Telehealth: Payer: Self-pay | Admitting: Oncology

## 2020-02-03 ENCOUNTER — Inpatient Hospital Stay: Payer: BC Managed Care – PPO

## 2020-02-03 ENCOUNTER — Other Ambulatory Visit: Payer: Self-pay | Admitting: Hematology and Oncology

## 2020-02-03 ENCOUNTER — Other Ambulatory Visit: Payer: Self-pay | Admitting: Oncology

## 2020-02-03 ENCOUNTER — Inpatient Hospital Stay: Payer: BC Managed Care – PPO | Attending: Oncology | Admitting: Oncology

## 2020-02-03 ENCOUNTER — Other Ambulatory Visit: Payer: Self-pay

## 2020-02-03 DIAGNOSIS — Z85810 Personal history of malignant neoplasm of tongue: Secondary | ICD-10-CM | POA: Insufficient documentation

## 2020-02-03 DIAGNOSIS — C01 Malignant neoplasm of base of tongue: Secondary | ICD-10-CM

## 2020-02-03 DIAGNOSIS — Z0001 Encounter for general adult medical examination with abnormal findings: Secondary | ICD-10-CM | POA: Diagnosis not present

## 2020-02-03 DIAGNOSIS — D509 Iron deficiency anemia, unspecified: Secondary | ICD-10-CM | POA: Diagnosis not present

## 2020-02-03 DIAGNOSIS — Z923 Personal history of irradiation: Secondary | ICD-10-CM | POA: Diagnosis not present

## 2020-02-03 DIAGNOSIS — Z79899 Other long term (current) drug therapy: Secondary | ICD-10-CM | POA: Insufficient documentation

## 2020-02-03 DIAGNOSIS — Z9221 Personal history of antineoplastic chemotherapy: Secondary | ICD-10-CM | POA: Diagnosis not present

## 2020-02-03 LAB — CBC AND DIFFERENTIAL
HCT: 33 — AB (ref 41–53)
Hemoglobin: 10.5 — AB (ref 13.5–17.5)
Neutrophils Absolute: 3.28
Platelets: 325 (ref 150–399)
WBC: 4.9

## 2020-02-03 LAB — HEPATIC FUNCTION PANEL
ALT: 15 (ref 10–40)
AST: 25 (ref 14–40)
Alkaline Phosphatase: 60 (ref 25–125)
Bilirubin, Total: 0.5

## 2020-02-03 LAB — COMPREHENSIVE METABOLIC PANEL
Albumin: 4.2 (ref 3.5–5.0)
Calcium: 9 (ref 8.7–10.7)

## 2020-02-03 LAB — BASIC METABOLIC PANEL
BUN: 16 (ref 4–21)
CO2: 31 — AB (ref 13–22)
Chloride: 102 (ref 99–108)
Creatinine: 0.8 (ref 0.6–1.3)
Glucose: 103
Potassium: 3.8 (ref 3.4–5.3)
Sodium: 140 (ref 137–147)

## 2020-02-03 LAB — CBC
MCV: 69 — AB (ref 80–84)
RBC: 4.79 (ref 3.87–5.11)

## 2020-02-03 LAB — TSH: TSH: 4.533 u[IU]/mL — ABNORMAL HIGH (ref 0.350–4.500)

## 2020-02-03 NOTE — Telephone Encounter (Signed)
Per 12/21 LOS, patient scheduled for June 2022 Appt's.  Gave patient Appt Summary

## 2020-02-04 DIAGNOSIS — D509 Iron deficiency anemia, unspecified: Secondary | ICD-10-CM

## 2020-02-04 HISTORY — DX: Iron deficiency anemia, unspecified: D50.9

## 2020-02-10 ENCOUNTER — Encounter: Payer: Self-pay | Admitting: Oncology

## 2020-02-12 ENCOUNTER — Other Ambulatory Visit: Payer: Self-pay | Admitting: Pharmacist

## 2020-02-12 ENCOUNTER — Telehealth: Payer: Self-pay | Admitting: Oncology

## 2020-02-12 NOTE — Telephone Encounter (Signed)
Patient rescheduled his Feraheme scheduled for 02/16/2020 and 02/23/2020 due to [place].  Reschduled to 02/23/2020 Time Chg to 3:00 pm 03/01/2020 at 3:00 pm

## 2020-02-16 ENCOUNTER — Inpatient Hospital Stay: Payer: BC Managed Care – PPO

## 2020-02-23 ENCOUNTER — Ambulatory Visit: Payer: BC Managed Care – PPO

## 2020-02-23 ENCOUNTER — Other Ambulatory Visit: Payer: Self-pay

## 2020-02-23 ENCOUNTER — Inpatient Hospital Stay: Payer: BC Managed Care – PPO | Attending: Oncology

## 2020-02-23 VITALS — BP 159/82 | HR 70 | Temp 97.8°F | Resp 18 | Wt 245.0 lb

## 2020-02-23 DIAGNOSIS — D509 Iron deficiency anemia, unspecified: Secondary | ICD-10-CM | POA: Insufficient documentation

## 2020-02-23 DIAGNOSIS — Z79899 Other long term (current) drug therapy: Secondary | ICD-10-CM | POA: Insufficient documentation

## 2020-02-23 MED ORDER — SODIUM CHLORIDE 0.9 % IV SOLN
Freq: Once | INTRAVENOUS | Status: AC
  Filled 2020-02-23: qty 250

## 2020-02-23 MED ORDER — SODIUM CHLORIDE 0.9 % IV SOLN
510.0000 mg | Freq: Once | INTRAVENOUS | Status: AC
  Administered 2020-02-23: 510 mg via INTRAVENOUS
  Filled 2020-02-23: qty 17

## 2020-02-23 NOTE — Patient Instructions (Signed)
Ferumoxytol injection What is this medicine? FERUMOXYTOL is an iron complex. Iron is used to make healthy red blood cells, which carry oxygen and nutrients throughout the body. This medicine is used to treat iron deficiency anemia. This medicine may be used for other purposes; ask your health care provider or pharmacist if you have questions. COMMON BRAND NAME(S): Feraheme What should I tell my health care provider before I take this medicine? They need to know if you have any of these conditions:  anemia not caused by low iron levels  high levels of iron in the blood  magnetic resonance imaging (MRI) test scheduled  an unusual or allergic reaction to iron, other medicines, foods, dyes, or preservatives  pregnant or trying to get pregnant  breast-feeding How should I use this medicine? This medicine is for injection into a vein. It is given by a health care professional in a hospital or clinic setting. Talk to your pediatrician regarding the use of this medicine in children. Special care may be needed. Overdosage: If you think you have taken too much of this medicine contact a poison control center or emergency room at once. NOTE: This medicine is only for you. Do not share this medicine with others. What if I miss a dose? It is important not to miss your dose. Call your doctor or health care professional if you are unable to keep an appointment. What may interact with this medicine? This medicine may interact with the following medications:  other iron products This list may not describe all possible interactions. Give your health care provider a list of all the medicines, herbs, non-prescription drugs, or dietary supplements you use. Also tell them if you smoke, drink alcohol, or use illegal drugs. Some items may interact with your medicine. What should I watch for while using this medicine? Visit your doctor or healthcare professional regularly. Tell your doctor or healthcare  professional if your symptoms do not start to get better or if they get worse. You may need blood work done while you are taking this medicine. You may need to follow a special diet. Talk to your doctor. Foods that contain iron include: whole grains/cereals, dried fruits, beans, or peas, leafy green vegetables, and organ meats (liver, kidney). What side effects may I notice from receiving this medicine? Side effects that you should report to your doctor or health care professional as soon as possible:  allergic reactions like skin rash, itching or hives, swelling of the face, lips, or tongue  breathing problems  changes in blood pressure  feeling faint or lightheaded, falls  fever or chills  flushing, sweating, or hot feelings  swelling of the ankles or feet Side effects that usually do not require medical attention (report to your doctor or health care professional if they continue or are bothersome):  diarrhea  headache  nausea, vomiting  stomach pain This list may not describe all possible side effects. Call your doctor for medical advice about side effects. You may report side effects to FDA at 1-800-FDA-1088. Where should I keep my medicine? This drug is given in a hospital or clinic and will not be stored at home. NOTE: This sheet is a summary. It may not cover all possible information. If you have questions about this medicine, talk to your doctor, pharmacist, or health care provider.  2021 Elsevier/Gold Standard (2016-03-20 20:21:10)  

## 2020-02-24 ENCOUNTER — Telehealth: Payer: Self-pay

## 2020-02-24 NOTE — Telephone Encounter (Signed)
Pt called to ask if Dr Bobby Rumpf recommends getting COVID vaccines. I instructed pt that Dr Bobby Rumpf encouraged all of his pt's to receive the COVID vaccinations. Pt verbalized understanding.

## 2020-03-01 ENCOUNTER — Telehealth: Payer: Self-pay | Admitting: Oncology

## 2020-03-01 ENCOUNTER — Inpatient Hospital Stay: Payer: BC Managed Care – PPO

## 2020-03-01 NOTE — Telephone Encounter (Signed)
1/17 spoke with patient and rs appt -due to weather 

## 2020-03-05 ENCOUNTER — Other Ambulatory Visit: Payer: Self-pay | Admitting: Pharmacist

## 2020-03-05 ENCOUNTER — Ambulatory Visit: Payer: BC Managed Care – PPO

## 2020-03-05 DIAGNOSIS — D509 Iron deficiency anemia, unspecified: Secondary | ICD-10-CM

## 2020-03-08 ENCOUNTER — Encounter: Payer: Self-pay | Admitting: Pharmacist

## 2020-03-08 ENCOUNTER — Other Ambulatory Visit: Payer: Self-pay

## 2020-03-08 ENCOUNTER — Inpatient Hospital Stay: Payer: BC Managed Care – PPO

## 2020-03-08 VITALS — BP 139/80 | HR 88 | Temp 96.5°F | Resp 20 | Ht 67.0 in | Wt 239.0 lb

## 2020-03-08 DIAGNOSIS — D509 Iron deficiency anemia, unspecified: Secondary | ICD-10-CM | POA: Diagnosis not present

## 2020-03-08 DIAGNOSIS — Z79899 Other long term (current) drug therapy: Secondary | ICD-10-CM | POA: Diagnosis not present

## 2020-03-08 MED ORDER — DIPHENHYDRAMINE HCL 50 MG/ML IJ SOLN
12.5000 mg | Freq: Once | INTRAMUSCULAR | Status: AC | PRN
  Administered 2020-03-08: 12.5 mg via INTRAVENOUS

## 2020-03-08 MED ORDER — SODIUM CHLORIDE 0.9 % IV SOLN
510.0000 mg | Freq: Once | INTRAVENOUS | Status: AC
  Administered 2020-03-08: 510 mg via INTRAVENOUS
  Filled 2020-03-08: qty 17

## 2020-03-08 MED ORDER — SODIUM CHLORIDE 0.9 % IV SOLN
Freq: Once | INTRAVENOUS | Status: AC
  Filled 2020-03-08: qty 250

## 2020-03-08 MED ORDER — FAMOTIDINE IN NACL 20-0.9 MG/50ML-% IV SOLN
20.0000 mg | Freq: Once | INTRAVENOUS | Status: AC | PRN
  Administered 2020-03-08: 20 mg via INTRAVENOUS

## 2020-03-08 NOTE — Patient Instructions (Signed)
Ferumoxytol injection What is this medicine? FERUMOXYTOL is an iron complex. Iron is used to make healthy red blood cells, which carry oxygen and nutrients throughout the body. This medicine is used to treat iron deficiency anemia. This medicine may be used for other purposes; ask your health care provider or pharmacist if you have questions. COMMON BRAND NAME(S): Feraheme What should I tell my health care provider before I take this medicine? They need to know if you have any of these conditions:  anemia not caused by low iron levels  high levels of iron in the blood  magnetic resonance imaging (MRI) test scheduled  an unusual or allergic reaction to iron, other medicines, foods, dyes, or preservatives  pregnant or trying to get pregnant  breast-feeding How should I use this medicine? This medicine is for injection into a vein. It is given by a health care professional in a hospital or clinic setting. Talk to your pediatrician regarding the use of this medicine in children. Special care may be needed. Overdosage: If you think you have taken too much of this medicine contact a poison control center or emergency room at once. NOTE: This medicine is only for you. Do not share this medicine with others. What if I miss a dose? It is important not to miss your dose. Call your doctor or health care professional if you are unable to keep an appointment. What may interact with this medicine? This medicine may interact with the following medications:  other iron products This list may not describe all possible interactions. Give your health care provider a list of all the medicines, herbs, non-prescription drugs, or dietary supplements you use. Also tell them if you smoke, drink alcohol, or use illegal drugs. Some items may interact with your medicine. What should I watch for while using this medicine? Visit your doctor or healthcare professional regularly. Tell your doctor or healthcare  professional if your symptoms do not start to get better or if they get worse. You may need blood work done while you are taking this medicine. You may need to follow a special diet. Talk to your doctor. Foods that contain iron include: whole grains/cereals, dried fruits, beans, or peas, leafy green vegetables, and organ meats (liver, kidney). What side effects may I notice from receiving this medicine? Side effects that you should report to your doctor or health care professional as soon as possible:  allergic reactions like skin rash, itching or hives, swelling of the face, lips, or tongue  breathing problems  changes in blood pressure  feeling faint or lightheaded, falls  fever or chills  flushing, sweating, or hot feelings  swelling of the ankles or feet Side effects that usually do not require medical attention (report to your doctor or health care professional if they continue or are bothersome):  diarrhea  headache  nausea, vomiting  stomach pain This list may not describe all possible side effects. Call your doctor for medical advice about side effects. You may report side effects to FDA at 1-800-FDA-1088. Where should I keep my medicine? This drug is given in a hospital or clinic and will not be stored at home. NOTE: This sheet is a summary. It may not cover all possible information. If you have questions about this medicine, talk to your doctor, pharmacist, or health care provider.  2021 Elsevier/Gold Standard (2016-03-20 20:21:10)  

## 2020-03-13 DIAGNOSIS — J984 Other disorders of lung: Secondary | ICD-10-CM | POA: Diagnosis not present

## 2020-03-13 DIAGNOSIS — R202 Paresthesia of skin: Secondary | ICD-10-CM | POA: Diagnosis not present

## 2020-03-13 DIAGNOSIS — U071 COVID-19: Secondary | ICD-10-CM | POA: Diagnosis not present

## 2020-03-13 DIAGNOSIS — H538 Other visual disturbances: Secondary | ICD-10-CM | POA: Diagnosis not present

## 2020-03-17 ENCOUNTER — Other Ambulatory Visit: Payer: Self-pay | Admitting: Internal Medicine

## 2020-03-17 DIAGNOSIS — I10 Essential (primary) hypertension: Secondary | ICD-10-CM

## 2020-03-19 ENCOUNTER — Telehealth: Payer: Self-pay | Admitting: Internal Medicine

## 2020-03-19 ENCOUNTER — Other Ambulatory Visit: Payer: Self-pay

## 2020-03-19 MED ORDER — HYDROXYZINE HCL 25 MG PO TABS: 25.0000 mg | ORAL_TABLET | Freq: Four times a day (QID) | ORAL | 0 refills | Status: DC

## 2020-03-19 NOTE — Telephone Encounter (Signed)
Pt having panic attacks for a few days now, Pt is requesting hydrOXYzine (ATARAX/VISTARIL) 25 MG tablet @ [id number] - [place], [place] - [address] (Ph: [phone]) Please advise and thank you

## 2020-03-19 NOTE — Telephone Encounter (Signed)
Refill sent.

## 2020-04-01 ENCOUNTER — Other Ambulatory Visit: Payer: Self-pay | Admitting: Internal Medicine

## 2020-04-01 DIAGNOSIS — Z1211 Encounter for screening for malignant neoplasm of colon: Secondary | ICD-10-CM

## 2020-04-01 DIAGNOSIS — K921 Melena: Secondary | ICD-10-CM

## 2020-04-27 ENCOUNTER — Ambulatory Visit: Payer: BC Managed Care – PPO | Admitting: Internal Medicine

## 2020-05-12 ENCOUNTER — Telehealth (INDEPENDENT_AMBULATORY_CARE_PROVIDER_SITE_OTHER): Payer: BC Managed Care – PPO | Admitting: Nurse Practitioner

## 2020-05-12 ENCOUNTER — Other Ambulatory Visit: Payer: Self-pay

## 2020-05-12 ENCOUNTER — Ambulatory Visit: Payer: BC Managed Care – PPO | Admitting: Internal Medicine

## 2020-05-12 DIAGNOSIS — R7989 Other specified abnormal findings of blood chemistry: Secondary | ICD-10-CM | POA: Diagnosis not present

## 2020-05-12 NOTE — Progress Notes (Signed)
Virtual Visit via Video Note  I connected with Matthew Hatfield on 05/12/20 at  3:00 PM EDT by a video enabled telemedicine application and verified that I am speaking with the correct person using two identifiers.  Location: Patient: home Provider: office   I discussed the limitations of evaluation and management by telemedicine and the availability of in person appointments. The patient expressed understanding and agreed to proceed.  History of Present Illness:  Patient presents today for 29-month follow-up.  Patient was last seen by Dr. Juleen China in November 2021.  Upon reviewing his labs it was noted that his TSH was abnormal.  We will recheck a thyroid panel.  Patient does have history of throat and tongue cancer.  He has received radiation for this.  Patient states that overall he has been doing well.  He did recently lose his wife.  He states that he is doing much better with his anxiety related to this.  He has not needed to take his Vistaril the last few weeks. Denies f/c/s, n/v/d, hemoptysis, PND, chest pain or edema.     Observations/Objective:  Vitals with BMI 03/08/2020 02/23/2020 02/03/2020  Height 5\' 7"  - 5\' 7"   Weight 239 lbs 245 lbs 239 lbs 2 oz  BMI 37.43 87.86 76.72  Systolic 094 709 628  Diastolic 80 82 90  Pulse 88 70 65      Assessment and Plan:  Essential hypertension BP slightly above goal; however, patient has been out of HCTZ for a few days and has better control with home readings. Continue current regimen. Lab monitoring due to medications needed.  Counseled on blood pressure goal of less than 130/80, low-sodium, DASH diet, medication compliance, 150 minutes of moderate intensity exercise per week. Discussed medication compliance, adverse effects. - amLODipine (NORVASC) 10 MG tablet; Take 1 tablet (10 mg total) by mouth daily.  Dispense: 90 tablet; Refill: 0 - hydrochlorothiazide (HYDRODIURIL) 25 MG tablet; Take 1 tablet (25 mg total) by mouth daily.   Dispense: 90 tablet; Refill: 0 - lisinopril (ZESTRIL) 10 MG tablet; Take 1 tablet (10 mg total) by mouth daily.  Dispense: 90 tablet; Refill: 0   Dyslipidemia On Lipitor 20 mg    Follow Up Instructions:  Follow up with Dr, Juleen China in 1 month    I discussed the assessment and treatment plan with the patient. The patient was provided an opportunity to ask questions and all were answered. The patient agreed with the plan and demonstrated an understanding of the instructions.   The patient was advised to call back or seek an in-person evaluation if the symptoms worsen or if the condition fails to improve as anticipated.  I provided 23 minutes of non-face-to-face time during this encounter.  Video connection was lost when less than 50% of the duration of the visit was complete, at which time the remainder of the visit was completed via audio only.    Fenton Foy, NP

## 2020-05-12 NOTE — Patient Instructions (Signed)
Essential hypertension BP slightly above goal; however, patient has been out of HCTZ for a few days and has better control with home readings. Continue current regimen. Lab monitoring due to medications needed.  Counseled on blood pressure goal of less than 130/80, low-sodium, DASH diet, medication compliance, 150 minutes of moderate intensity exercise per week. Discussed medication compliance, adverse effects. - amLODipine (NORVASC) 10 MG tablet; Take 1 tablet (10 mg total) by mouth daily.  Dispense: 90 tablet; Refill: 0 - hydrochlorothiazide (HYDRODIURIL) 25 MG tablet; Take 1 tablet (25 mg total) by mouth daily.  Dispense: 90 tablet; Refill: 0 - lisinopril (ZESTRIL) 10 MG tablet; Take 1 tablet (10 mg total) by mouth daily.  Dispense: 90 tablet; Refill: 0   Dyslipidemia On Lipitor 20 mg   Elevated TSH:  Will order complete thyroid panel    Follow Up Instructions:  Follow up with Dr, Juleen China in 1 month

## 2020-05-17 ENCOUNTER — Other Ambulatory Visit: Payer: BC Managed Care – PPO

## 2020-05-26 ENCOUNTER — Other Ambulatory Visit: Payer: Self-pay

## 2020-05-26 ENCOUNTER — Ambulatory Visit (AMBULATORY_SURGERY_CENTER): Payer: BC Managed Care – PPO | Admitting: *Deleted

## 2020-05-26 VITALS — Ht 70.0 in | Wt 233.0 lb

## 2020-05-26 DIAGNOSIS — K921 Melena: Secondary | ICD-10-CM

## 2020-05-26 DIAGNOSIS — Z1211 Encounter for screening for malignant neoplasm of colon: Secondary | ICD-10-CM

## 2020-05-26 MED ORDER — PEG 3350-KCL-NA BICARB-NACL 420 G PO SOLR: 4000.0000 mL | Freq: Once | ORAL | 0 refills | Status: AC

## 2020-05-26 NOTE — Progress Notes (Addendum)
No egg or soy allergy known to patient  No issues with past sedation with any surgeries or procedures Patient denies ever being told they had issues or difficulty with intubation  No FH of Malignant Hyperthermia No diet pills per patient No home 02 use per patient  No blood thinners per patient  Pt denies issues with constipation  No A fib or A flutter  EMMI video to pt or via [place] 19 guidelines implemented in PV today with Pt and RN  Pt is fully vaccinated  for Covid     Virtual pre visit completed. Instructions sent through [place].  Due to the COVID-19 pandemic we are asking patients to follow certain guidelines.  Pt aware of COVID protocols and [facility] guidelines

## 2020-05-30 ENCOUNTER — Other Ambulatory Visit: Payer: Self-pay | Admitting: Internal Medicine

## 2020-05-30 DIAGNOSIS — I10 Essential (primary) hypertension: Secondary | ICD-10-CM

## 2020-06-04 ENCOUNTER — Encounter: Payer: Self-pay | Admitting: Gastroenterology

## 2020-06-09 ENCOUNTER — Telehealth: Payer: Self-pay | Admitting: Gastroenterology

## 2020-06-09 ENCOUNTER — Encounter: Payer: BC Managed Care – PPO | Admitting: Gastroenterology

## 2020-06-09 NOTE — Telephone Encounter (Signed)
Patient called in stating he had a family emergency and unable to make his colon procedure 06/09/2020. He states he called last week after hours and left a message about the cancellation but no one returned his call. He will call back to reschedule as he want an appointment in August.

## 2020-06-15 ENCOUNTER — Other Ambulatory Visit: Payer: Self-pay | Admitting: Internal Medicine

## 2020-06-15 DIAGNOSIS — I10 Essential (primary) hypertension: Secondary | ICD-10-CM

## 2020-06-20 ENCOUNTER — Encounter: Payer: Self-pay | Admitting: *Deleted

## 2020-06-20 ENCOUNTER — Other Ambulatory Visit: Payer: Self-pay

## 2020-06-20 ENCOUNTER — Ambulatory Visit
Admission: EM | Admit: 2020-06-20 | Discharge: 2020-06-20 | Disposition: A | Discharge status: Home / Self Care | Payer: BC Managed Care – PPO | Attending: Emergency Medicine | Admitting: Emergency Medicine

## 2020-06-20 DIAGNOSIS — S39012A Strain of muscle, fascia and tendon of lower back, initial encounter: Secondary | ICD-10-CM

## 2020-06-20 DIAGNOSIS — M62838 Other muscle spasm: Secondary | ICD-10-CM

## 2020-06-20 MED ORDER — TIZANIDINE HCL 4 MG PO TABS: 4.0000 mg | ORAL_TABLET | Freq: Three times a day (TID) | ORAL | 0 refills | Status: DC | PRN

## 2020-06-20 MED ORDER — IBUPROFEN 600 MG PO TABS: 600.0000 mg | ORAL_TABLET | Freq: Four times a day (QID) | ORAL | 0 refills | Status: DC | PRN

## 2020-06-20 NOTE — Discharge Instructions (Addendum)
600 mg of ibuprofen combined with 1000 mg of Tylenol together 3-4 times a day as needed for pain.  Zanaflex will help with muscle spasms.  Hot baths, heating pads and deep tissue massage can be very helpful.

## 2020-06-20 NOTE — ED Triage Notes (Signed)
Pt reports bending over to pick up an object this AM when he felt sudden onset pain across low back; denies radiation of pain or any new parasthesias.

## 2020-06-20 NOTE — ED Provider Notes (Signed)
HPI  SUBJECTIVE:  Matthew Hatfield is a 55 y.o. male who presents with bilateral constant, sharp, low back pain after bending over to tie his shoe.  He states that he felt a "pop" and then his back seized up with immediate pain.  No trauma to the back, saddle anesthesia, urinary or fecal incontinence, urinary retention.  No numbness or pain radiating down his leg.  No change in his baseline numbness or tingling in his feet, fevers.  He tried lying on the floor with improvement in his pain.  Symptoms are worse with torso rotation, bending forward, going from sitting to standing.  He has a past medical history of testicular cancer in 2009, throat cancer 2 years ago, has been cancer free since.  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  He has remote history of injury to his back.  No history of osteoporosis, chronic kidney disease.  PMD: [facility].   Past Medical History:  Diagnosis Date  . Cancer of base of tongue ([place])   . GERD (gastroesophageal reflux disease)   . Hyperlipidemia   . Hypertension   . Personal history of testicular cancer   . Throat cancer [facility])     Past Surgical History:  Procedure Laterality Date  . CHOLECYSTECTOMY    . COLONOSCOPY    . HERNIA REPAIR    . KNEE SURGERY    . RADICAL ORCHIECTOMY Right   . TONSILLECTOMY      Family History  Problem Relation Age of Onset  . Cancer Mother   . Heart disease Mother   . Lung cancer Father   . Diabetes Maternal Uncle   . Colon polyps Neg Hx   . Colon cancer Neg Hx   . Esophageal cancer Neg Hx   . Stomach cancer Neg Hx   . Rectal cancer Neg Hx     Social History   Tobacco Use  . Smoking status: Never Smoker  . Smokeless tobacco: Never Used  Vaping Use  . Vaping Use: Never used  Substance Use Topics  . Alcohol use: No  . Drug use: No    No current facility-administered medications for this encounter.  Current Outpatient Medications:  .  amLODipine (NORVASC) 10 MG tablet, TAKE 1 TABLET BY MOUTH EVERY DAY,  Disp: 90 tablet, Rfl: 0 .  aspirin 81 MG chewable tablet, Chew 81 mg by mouth., Disp: , Rfl:  .  atorvastatin (LIPITOR) 40 MG tablet, Take 1 tablet (40 mg total) by mouth daily., Disp: 90 tablet, Rfl: 3 .  hydrochlorothiazide (HYDRODIURIL) 25 MG tablet, TAKE 1 TABLET BY MOUTH EVERY DAY, Disp: 90 tablet, Rfl: 0 .  ibuprofen (ADVIL) 600 MG tablet, Take 1 tablet (600 mg total) by mouth every 6 (six) hours as needed., Disp: 30 tablet, Rfl: 0 .  lisinopril (ZESTRIL) 10 MG tablet, TAKE 1 TABLET BY MOUTH EVERY DAY, Disp: 30 tablet, Rfl: 0 .  tiZANidine (ZANAFLEX) 4 MG tablet, Take 1 tablet (4 mg total) by mouth every 8 (eight) hours as needed for muscle spasms., Disp: 30 tablet, Rfl: 0 .  hydrOXYzine (ATARAX/VISTARIL) 25 MG tablet, Take 1 tablet (25 mg total) by mouth every 6 (six) hours. (Patient not taking: Reported on 05/26/2020), Disp: 16 tablet, Rfl: 0  Allergies  Allergen Reactions  . Other Hives and Other (See Comments)    Cat and dog dander Racing heart  . Lidocaine Swelling    Pt has used magic mouthwash and viscous lidocaine with oral swelling  . Shellfish-Derived Products Hives  .  Antihistamines, Diphenhydramine-Type Palpitations  . Feraheme [Ferumoxytol] Hives, Itching and Rash    Pt had redness of upper back and arms with itching.  Also had raised hives on upper arm.  Treated with pepcid 20 mg and diphenhydramine 12.5mg .     ROS  As noted in HPI.   Physical Exam  BP 134/80   Pulse 78   Temp 98.2 F (36.8 C) (Temporal)   Resp 16   SpO2 96%   Constitutional: Well developed, well nourished, no acute distress Eyes:  EOMI, conjunctiva normal bilaterally HENT: Normocephalic, atraumatic,mucus membranes moist Respiratory: Normal inspiratory effort Cardiovascular: Normal rate GI: nondistended. No suprapubic tenderness skin: No rash, skin intact Musculoskeletal: no CVAT. + left paralumbar tenderness, + muscle spasm.  Positive midline bony tenderness at L4/L5./S1. Bilateral lower  extremities nontender, baseline ROM with intact  PT pulses, mild pain with pain with int/ext rotation hips bilaterally.  No pain with flex/extension hips bilaterally. SLR neg bilaterally. Sensation baseline light touch bilaterally for Pt, DTR's symmetric and intact bilaterally KJ , Motor symmetric bilateral 5/5 hip flexion, quadriceps, hamstrings, EHL, foot dorsiflexion, foot plantarflexion, gait normal Neurologic: Alert & oriented x 3, no focal neuro deficits Psychiatric: Speech and behavior appropriate   ED Course   Medications - No data to display  No orders of the defined types were placed in this encounter.   No results found for this or any previous visit (from the past 24 hour(s)). No results found.  ED Clinical Impression  1. Strain of lumbar region, initial encounter   2. Muscle spasm    ED Assessment/Plan  No historical red flags as noted in HPI.  While he does have bony tenderness along the lower L-spine and a history of cancer, he has been cancer free for at least 2 years.  Discussed with patient that the likelihood of this being a pathologic bone fracture or metastasis is unlikely, but still a distant possibility.  We have decided to try treating it as a musculoskeletal issue first, and if he is not getting any better, he will follow-up with his doctor for imaging.  Home with Zanaflex, Tylenol/ibuprofen.  Work note for 2 days.  ER return precautions given  Discussed medical decision-making, and plan for follow-up with the patient.  Discussed signs and symptoms that should prompt return to the emergency department.  Patient agrees with plan.   Meds ordered this encounter  Medications  . ibuprofen (ADVIL) 600 MG tablet    Sig: Take 1 tablet (600 mg total) by mouth every 6 (six) hours as needed.    Dispense:  30 tablet    Refill:  0  . tiZANidine (ZANAFLEX) 4 MG tablet    Sig: Take 1 tablet (4 mg total) by mouth every 8 (eight) hours as needed for muscle spasms.     Dispense:  30 tablet    Refill:  0    *This clinic note was created using [employer]. Therefore, there may be occasional mistakes despite careful proofreading.  ?     Melynda Ripple, MD 06/21/20 [phone]

## 2020-06-21 ENCOUNTER — Other Ambulatory Visit: Payer: Self-pay | Admitting: Internal Medicine

## 2020-06-21 DIAGNOSIS — I10 Essential (primary) hypertension: Secondary | ICD-10-CM

## 2020-06-22 ENCOUNTER — Encounter: Payer: Self-pay | Admitting: Internal Medicine

## 2020-06-22 ENCOUNTER — Other Ambulatory Visit: Payer: Self-pay

## 2020-06-22 ENCOUNTER — Ambulatory Visit (INDEPENDENT_AMBULATORY_CARE_PROVIDER_SITE_OTHER): Payer: BC Managed Care – PPO

## 2020-06-22 ENCOUNTER — Ambulatory Visit (INDEPENDENT_AMBULATORY_CARE_PROVIDER_SITE_OTHER): Payer: BC Managed Care – PPO | Admitting: Internal Medicine

## 2020-06-22 VITALS — BP 129/78 | HR 66 | Temp 97.3°F | Resp 24 | Wt 246.0 lb

## 2020-06-22 DIAGNOSIS — R7989 Other specified abnormal findings of blood chemistry: Secondary | ICD-10-CM | POA: Diagnosis not present

## 2020-06-22 DIAGNOSIS — M545 Low back pain, unspecified: Secondary | ICD-10-CM

## 2020-06-22 MED ORDER — HYDROXYZINE HCL 25 MG PO TABS: 25.0000 mg | ORAL_TABLET | Freq: Four times a day (QID) | ORAL | 0 refills | Status: DC

## 2020-06-22 NOTE — Progress Notes (Signed)
  Subjective:    Matthew Hatfield - 55 y.o. male MRN [id number]  Date of birth: 01-Nov-1965  HPI  Matthew Hatfield is here for back pain. Reports that when he woke up on Sunday he went to put his left shoe on his lower back popped and he was on the floor for 4-5 hours. He went to Urgent Care later that day. He was given Tizanidine and Ibuprofen. Has been taking without relief. Also using heat to the area. Has had pulled muscles in the past and this does not feel similar. Feels like "on spine itself".   BACK PAIN  Back pain began 2 days ago. Pain is described as sharp, constant. Patient has tried rest, muscle relaxer, NSAID. Pain does not radiate. History of trauma or injury: started after bending over. No other trauma.   Prior history of similar pain: no History of cancer: testicular cancer 2009, throat cancer 2020; remission for 2 years  Weak immune system:  no History of IV drug use: no History of steroid use: no  Symptoms Incontinence of bowel or bladder:  no Numbness of leg: no Fever: no Rest or Night pain: yes  Weight Loss:  no* Rash: no   Health Maintenance:  Health Maintenance Due  Topic Date Due  . COVID-19 Vaccine (1) Never done  . COLONOSCOPY (Pts 45-71yrs Insurance coverage will need to be confirmed)  Never done    -  reports that he has never smoked. He has never used smokeless tobacco. - Review of Systems: Per HPI. - Past Medical History: Patient Active Problem List   Diagnosis Date Noted  . Iron deficiency anemia 02/04/2020  . Testicular cancer ([place]) 01/26/2015  . Essential hypertension 08/19/2013   - Medications: reviewed and updated   Objective:   Physical Exam BP 129/78   Pulse 66   Temp (!) 97.3 F (36.3 C)   Resp (!) 24   Wt 246 lb (111.6 kg)   SpO2 96%   BMI 35.30 kg/m  Physical Exam Constitutional:      General: He is not in acute distress.    Appearance: He is not diaphoretic.  Cardiovascular:     Rate and Rhythm: Normal rate.   Pulmonary:     Effort: Pulmonary effort is normal. No respiratory distress.  Musculoskeletal:     Comments: + left paralumbar tenderness, + muscle spasm.  Positive midline bony tenderness at L4/L5./S1. Bilateral lower extremities nontender, baseline ROM with intact  PT pulses, mild pain with pain with int/ext rotation hips bilaterally.  No pain with flex/extension hips bilaterally.   Skin:    General: Skin is warm and dry.  Neurological:     Mental Status: He is alert and oriented to person, place, and time.  Psychiatric:        Mood and Affect: Affect normal.        Judgment: Judgment normal.            Assessment & Plan:   1. Acute midline low back pain without sciatica Suspect related to muscle strain given acute nature of pain with a movement. However, given severe pain with h/o cancer and so far no improvement with conservative treatments, will obtain lumbar xray. Will write patient out of work through 5/25 and then reevaluate. If lumbar xray unremarkable but no improvement in a few weeks, may warrant further imaging with MRI.  - DG Lumbar Spine Complete     Phill Myron, D.O. 06/22/2020, 2:57 PM Primary Care at [facility]

## 2020-06-22 NOTE — Progress Notes (Signed)
Continuous back pain 7/10 pain No relief with medications

## 2020-06-23 ENCOUNTER — Ambulatory Visit: Payer: BC Managed Care – PPO | Admitting: Internal Medicine

## 2020-06-24 DIAGNOSIS — C01 Malignant neoplasm of base of tongue: Secondary | ICD-10-CM | POA: Insufficient documentation

## 2020-06-24 HISTORY — DX: Malignant neoplasm of base of tongue: C01

## 2020-06-24 NOTE — Progress Notes (Signed)
Matthew Hatfield  [address] [place],  [place]  [address] [phone]  Clinic Day:  06/26/2019  Referring physician: Caryl Never*  This document serves as a record of services personally performed by Matthew Potter, MD. It was created on their behalf by [facility], a trained medical scribe. The creation of this record is based on the scribe's personal observations and the provider's statements to them.  HISTORY OF PRESENT ILLNESS:  The patient is a 55 y.o. 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  He completed concurrent chemoradiation, which consisted of weekly carboplatin/paclitaxel, in May 2019.  All scans and physical exams since then have shown no signs of disease recurrence.  Of note, he was also found to have iron deficiency anemia, for which he received IV Feraheme.  He comes in today off-schedule after he hurt his back while bending down.  An x-ray did not show any obvious fracture or other ominous findings.  However, as his provider mentioned cancer can spread to bone, he was concerned about his back issues being due to this.  He comes in today for clarification.  His back is still causing some pain to where he alternates tramadol and ibuprofen throughout throughout the day.  He denies having any oral symptoms which concern him for recurrent base of tongue cancer.  He also has a history of seminoma, for which he underwent a orchiectomy in 2008, followed by BEP chemotherapy, which was completed in 2009. Scans and exams since then never showed signs of disease recurrence.  He has been considered cured of this disease.    PHYSICAL EXAM:  Blood pressure (!) 183/88, pulse 68, temperature 98 F (36.7 C), resp. rate 16, height 5\' 7"  (1.702 m), weight 251 lb 4.8 oz (114 kg), SpO2 98 %. Wt Readings from Last 3 Encounters:  06/25/20 251 lb 4.8 oz (114 kg)  06/22/20 246 lb (111.6 kg)   05/26/20 233 lb (105.7 kg)   Body mass index is 39.36 kg/m. Performance status (ECOG): 0 Physical Exam Constitutional:      Appearance: Normal appearance. He is not ill-appearing.  HENT:     Mouth/Throat:     Mouth: Mucous membranes are moist.     Pharynx: Oropharynx is clear. No oropharyngeal exudate or posterior oropharyngeal erythema.  Cardiovascular:     Rate and Rhythm: Normal rate and regular rhythm.     Heart sounds: No murmur heard. No friction rub. No gallop.   Pulmonary:     Effort: Pulmonary effort is normal. No respiratory distress.     Breath sounds: Normal breath sounds. No wheezing, rhonchi or rales.  Chest:  Breasts:     Right: No axillary adenopathy or supraclavicular adenopathy.     Left: No axillary adenopathy or supraclavicular adenopathy.    Abdominal:     General: Bowel sounds are normal. There is no distension.     Palpations: Abdomen is soft. There is no mass.     Tenderness: There is no abdominal tenderness.  Musculoskeletal:        General: No swelling.     Right lower leg: No edema.     Left lower leg: No edema.  Lymphadenopathy:     Cervical: No cervical adenopathy.     Upper Body:     Right upper body: No supraclavicular or axillary adenopathy.     Left upper body: No supraclavicular or axillary adenopathy.  Lower Body: No right inguinal adenopathy. No left inguinal adenopathy.  Skin:    General: Skin is warm.     Coloration: Skin is not jaundiced.     Findings: No lesion or rash.  Neurological:     General: No focal deficit present.     Mental Status: He is alert and oriented to person, place, and time. Mental status is at baseline.     Cranial Nerves: Cranial nerves are intact.  Psychiatric:        Mood and Affect: Mood normal.        Behavior: Behavior normal.        Thought Content: Thought content normal.   SCANS:  His recent back x-ray showed the following: FINDINGS: Frontal, bilateral oblique, lateral views of the lumbar  spine are obtained. There are 5 non-rib-bearing lumbar type vertebral bodies identified. No acute fracture. There is grade 1 anterolisthesis of L5 on S1, with bilateral L5 pars defects identified. Otherwise alignment is anatomic. Disc spaces are well preserved. Sacroiliac joints are normal.  IMPRESSION: 1. Bilateral L5 spondylolysis with grade 1 anterolisthesis of L5 on S1. 2. No acute fracture.  ASSESSMENT & PLAN:  Assessment/Plan:  A 55 y.o. 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  In clinic, I reassured the patient his back issues have nothing to do with recurrent cancer.  His 2 prior malignancies, base of tongue cancer and seminoma, usually spread to more places first before going to the bone.  He has been disease free for years, as it pertains to both malignancies.  If his back pain does not improve, his primary care office may need to order an MRI for further evaluation.   Otherwise, as he is doing well, I will see him back in 6 months for repeat clinical assessment.  The patient understands all the plans discussed today and is in agreement with them.    I, Rita Ohara, am acting as scribe for Matthew Potter, MD    I have reviewed this report as typed by the medical scribe, and it is complete and accurate.  Dequincy Macarthur Critchley, MD

## 2020-06-25 ENCOUNTER — Other Ambulatory Visit: Payer: Self-pay | Admitting: Oncology

## 2020-06-25 ENCOUNTER — Other Ambulatory Visit: Payer: Self-pay

## 2020-06-25 ENCOUNTER — Inpatient Hospital Stay: Payer: BC Managed Care – PPO | Attending: Oncology | Admitting: Oncology

## 2020-06-25 ENCOUNTER — Telehealth: Payer: Self-pay | Admitting: Oncology

## 2020-06-25 DIAGNOSIS — C01 Malignant neoplasm of base of tongue: Secondary | ICD-10-CM

## 2020-06-25 DIAGNOSIS — D509 Iron deficiency anemia, unspecified: Secondary | ICD-10-CM

## 2020-06-25 NOTE — Telephone Encounter (Signed)
Per 5/13 LOS, patient scheduled for Nov Appt's.  Canceled June Appt's.  Gave patient Appt Summary

## 2020-06-25 NOTE — Progress Notes (Signed)
PATIENT FELT "POP" IN BACK WHEN BENT OVER TO TIE SHOE.  XRAY WAS DONE BUT NO OTHER IMAGING.  WANTS TO MAKE SURE CANCER HAS NOT SPREAD TO BONES.

## 2020-06-29 ENCOUNTER — Other Ambulatory Visit: Payer: Self-pay | Admitting: Internal Medicine

## 2020-06-29 DIAGNOSIS — M47817 Spondylosis without myelopathy or radiculopathy, lumbosacral region: Secondary | ICD-10-CM

## 2020-06-29 DIAGNOSIS — M4316 Spondylolisthesis, lumbar region: Secondary | ICD-10-CM

## 2020-07-01 ENCOUNTER — Telehealth: Payer: Self-pay | Admitting: Internal Medicine

## 2020-07-01 ENCOUNTER — Telehealth (INDEPENDENT_AMBULATORY_CARE_PROVIDER_SITE_OTHER): Payer: Self-pay

## 2020-07-01 NOTE — Telephone Encounter (Signed)
-----   Message from Nicolette Bang, DO sent at 06/29/2020  5:42 PM EDT ----- Please call Mertha Finders about xray result. Shows bilateral spondylosis at L5 (lowest part of low back) which is age related wear and tear of spinal discs. Also shows mild anterolisthesis at L5 which is where one of the vertebrae slips forward on the one on top of it and there is malalignment of the spine. I would recommend he establish with ortho given these findings. Have placed referral.   Thanks,  Phill Myron, D.O. Primary Care at [facility]  06/29/2020, 5:39 PM

## 2020-07-01 NOTE — Telephone Encounter (Signed)
Pt calling in stating he was written out of work until May 25,2022 and is asking if his PCP can extend it and write a letter for him to be out until Jul 09, 2020.  Pt would like a call back to know if this can or cannot be done

## 2020-07-01 NOTE — Telephone Encounter (Signed)
Patient verified date of birth. He is aware of results of Xray per PCP. He is scheduled to see ortho on 5/26. Patient is requesting a letter excusing him from work until seen on 5/26. States he will call the office to request. Nat Christen, CMA

## 2020-07-02 NOTE — Telephone Encounter (Signed)
Please write letter excusing patient for work through and including 5/26. Thanks!   Phill Myron, D.O. Primary Care at [facility]  07/02/2020, 9:40 AM

## 2020-07-04 ENCOUNTER — Other Ambulatory Visit: Payer: Self-pay | Admitting: Internal Medicine

## 2020-07-05 NOTE — Telephone Encounter (Signed)
Yes that can be done. Please update the letter for 5/27. Thanks!   Phill Myron, D.O. Primary Care at [facility]  07/05/2020, 10:39 AM

## 2020-07-05 NOTE — Telephone Encounter (Signed)
Letter typed and printed. Placed at front desk in file folder. Patient aware.   He states he will see his therapist and they will write a letter to extend or return to work. Just wanted to cover basis until he was able to see them.

## 2020-07-08 ENCOUNTER — Ambulatory Visit (INDEPENDENT_AMBULATORY_CARE_PROVIDER_SITE_OTHER): Payer: BC Managed Care – PPO | Admitting: Family Medicine

## 2020-07-08 ENCOUNTER — Encounter: Payer: Self-pay | Admitting: Family Medicine

## 2020-07-08 ENCOUNTER — Other Ambulatory Visit: Payer: Self-pay

## 2020-07-08 DIAGNOSIS — M545 Low back pain, unspecified: Secondary | ICD-10-CM | POA: Diagnosis not present

## 2020-07-08 DIAGNOSIS — M4316 Spondylolisthesis, lumbar region: Secondary | ICD-10-CM | POA: Insufficient documentation

## 2020-07-08 HISTORY — DX: Spondylolisthesis, lumbar region: M43.16

## 2020-07-08 NOTE — Progress Notes (Signed)
Office Visit Note   Patient: Matthew Hatfield           Date of Birth: 21-Mar-1965           MRN: [id number] Visit Date: 07/08/2020 Requested by: Nicolette Bang, DO [place],  [place] [address] PCP: Nicolette Bang, DO  Subjective: Chief Complaint  Patient presents with  . Lower Back - Pain    Felt a pop in the center of his lower back when he bent down to put on his shoes almost 3 weeks ago. The pain "put me in the floor." Went to his PCP and had xrays. Was given pain medication - helps ease the pain to a 2 -- today, without medication, the pain is a 5. No radiating pain down the legs. The pain is still sitting in the center of the lower back. Occasional "muscle twitches."    HPI: He is here with low back pain.  Symptoms started about a month ago, he recalls bending forward to put on his shoe and feeling immediate severe pain in his back.  It "put him on the floor" for several hours.  He went to his PCP where x-rays revealed grade 1 spondylolisthesis at L5-S1 with mild to moderate degenerative disc disease.  He has continued to have pain but it is manageable with ibuprofen and Zanaflex as needed.  Unfortunately he has not been able to return to work.  He denies any radicular symptoms.  Pain is worse when bending backward, better when sitting or lying down.  No fevers or chills.  He has a history of testicular and tongue cancer and is considered cancer free.              ROS:   All other systems were reviewed and are negative.  Objective: Vital Signs: There were no vitals taken for this visit.  Physical Exam:  General:  Alert and oriented, in no acute distress. Pulm:  Breathing unlabored. Psy:  Normal mood, congruent affect. Skin: No rash Low back: He has tenderness at the L5-S1 level in the midline.  He has pain with 1 legged hyperextension bilaterally.  Lower extremity strength and reflexes are normal, straight leg raise is negative.  No pain with  internal hip rotation.  Imaging: No results found.  Assessment & Plan: 1.  Low back pain due to L5-S1 spondylolisthesis -We will refer him to [facility] PT.  Out of work for 4 more weeks.  MRI scan if he fails to improve.     Procedures: No procedures performed        PMFS History: Patient Active Problem List   Diagnosis Date Noted  . Spondylolisthesis, lumbar region 07/08/2020  . Malignant neoplasm of base of tongue ([place]) 06/24/2020  . Iron deficiency anemia 02/04/2020  . Seborrheic keratosis 03/15/2016  . Testicular cancer ([place]) 01/26/2015  . Essential hypertension 08/19/2013  . Seminoma ([place]) 08/06/2013   Past Medical History:  Diagnosis Date  . Cancer of base of tongue ([place])   . GERD (gastroesophageal reflux disease)   . Hyperlipidemia   . Hypertension   . Personal history of testicular cancer   . Throat cancer [facility])     Family History  Problem Relation Age of Onset  . Cancer Mother   . Heart disease Mother   . Lung cancer Father   . Diabetes Maternal Uncle   . Colon polyps Neg Hx   . Colon cancer Neg Hx   . Esophageal cancer Neg Hx   .  Stomach cancer Neg Hx   . Rectal cancer Neg Hx     Past Surgical History:  Procedure Laterality Date  . CHOLECYSTECTOMY    . COLONOSCOPY    . HERNIA REPAIR    . KNEE SURGERY    . RADICAL ORCHIECTOMY Right   . TONSILLECTOMY     Social History   Occupational History  . Not on file  Tobacco Use  . Smoking status: Never Smoker  . Smokeless tobacco: Never Used  Vaping Use  . Vaping Use: Never used  Substance and Sexual Activity  . Alcohol use: No  . Drug use: No  . Sexual activity: Not on file

## 2020-07-19 DIAGNOSIS — M545 Low back pain, unspecified: Secondary | ICD-10-CM | POA: Diagnosis not present

## 2020-07-19 DIAGNOSIS — M5126 Other intervertebral disc displacement, lumbar region: Secondary | ICD-10-CM | POA: Diagnosis not present

## 2020-07-21 DIAGNOSIS — M5126 Other intervertebral disc displacement, lumbar region: Secondary | ICD-10-CM | POA: Diagnosis not present

## 2020-07-21 DIAGNOSIS — M545 Low back pain, unspecified: Secondary | ICD-10-CM | POA: Diagnosis not present

## 2020-07-26 DIAGNOSIS — M5126 Other intervertebral disc displacement, lumbar region: Secondary | ICD-10-CM | POA: Diagnosis not present

## 2020-07-26 DIAGNOSIS — M545 Low back pain, unspecified: Secondary | ICD-10-CM | POA: Diagnosis not present

## 2020-07-28 ENCOUNTER — Encounter: Payer: Self-pay | Admitting: Family Medicine

## 2020-07-28 ENCOUNTER — Ambulatory Visit (INDEPENDENT_AMBULATORY_CARE_PROVIDER_SITE_OTHER): Payer: BC Managed Care – PPO | Admitting: Family Medicine

## 2020-07-28 DIAGNOSIS — M5126 Other intervertebral disc displacement, lumbar region: Secondary | ICD-10-CM | POA: Diagnosis not present

## 2020-07-28 DIAGNOSIS — M4316 Spondylolisthesis, lumbar region: Secondary | ICD-10-CM | POA: Diagnosis not present

## 2020-07-28 DIAGNOSIS — M545 Low back pain, unspecified: Secondary | ICD-10-CM

## 2020-07-28 NOTE — Progress Notes (Signed)
Office Visit Note   Patient: Matthew Hatfield           Date of Birth: 1965/03/15           MRN: [id number] Visit Date: 07/28/2020 Requested by: Nicolette Bang, MD [address] [place],  [place] [address] PCP: Nicolette Bang, MD  Subjective: Chief Complaint  Patient presents with   Lower Back - Pain    Pain is still right in the middle of the lower back. His back popped this morning when he squatted down to set something down. This did not "put him in the floor" this time, though. Went to PT after this. After walking a while, he is starting to have pain on the right side of the lower back.    HPI: 55yo M presenting to clinic with concerns of a loud popping from his back this morning when he was bending over to pick up cat food. Patient is currently in PT for low back pain, which also started with a loud popping noise, and he is scared he may have reinjured himself. He says that today's experience wasn't nearly as painful as the previous injury ('The one put me on the floor!') but the noise was very frightening. No radiation, no bowel/bladder dysfunction. States pain is primarily in his lower lumbar spine, and maybe just a little to the right of midline.   He is also concerned about getting back to work. His work restrictions expire in one week, but he still has two weeks of PT left. He would like to complete PT before going back to his very high demand work place.               Objective: Vital Signs: There were no vitals taken for this visit.  Physical Exam:  General:  Alert and oriented, in no acute distress. Pulm:  Breathing unlabored. Psy:  Normal mood, congruent affect. Skin:  No bruising or   BACK EXAMINATION: Normal Gait.  Normal Spinal curvature, though sits with excessive forward roll. ROM: Endorses Pain with forward flexion, and approximatley 10" from toe-touch. No pain with extension. Endorses some discomfort with rotation right.  Palpation:  Endorses tenderness along paraspinal muscles in lower lumbar area bilaterally, R>L. No midline tenderness.  Strength: Hip flexion (L1), Hip Aduction (L2), Knee Extension (L3) are 5/5 Bilaterally Foot Inversion (L4), Dorsiflexion (L5), and Eversion (S1) 5/5 Bilaterally Sensation: Intact to light touch medial and lateral aspects of lower extremities, and lateral, dorsal, and medial aspects of foot.  Seated slump with radiating pain on right, none on left.   Imaging: None today.  Assessment & Plan: 55yo M presenting to clinic with concerns of loud popping sensation from his lower back, without significant pain. Reassurance given that this was likely just a shifting, and as it wasn't painful, no further imaging is needed. Continue work with PT. Will extend work restrictions until he has graduated PT.  Patient expresses understanding, he had no further questions or concerns today.      Procedures: No procedures performed        PMFS History: Patient Active Problem List   Diagnosis Date Noted   Spondylolisthesis, lumbar region 07/08/2020   Malignant neoplasm of base of tongue ([place]) 06/24/2020   Iron deficiency anemia 02/04/2020   Seborrheic keratosis 03/15/2016   Testicular cancer ([place]) 01/26/2015   Essential hypertension 08/19/2013   Seminoma ([place]) 08/06/2013   Past Medical History:  Diagnosis Date   Cancer of base of  tongue (HCC)    GERD (gastroesophageal reflux disease)    Hyperlipidemia    Hypertension    Personal history of testicular cancer    Throat cancer ([place])     Family History  Problem Relation Age of Onset   Cancer Mother    Heart disease Mother    Lung cancer Father    Diabetes Maternal Uncle    Colon polyps Neg Hx    Colon cancer Neg Hx    Esophageal cancer Neg Hx    Stomach cancer Neg Hx    Rectal cancer Neg Hx     Past Surgical History:  Procedure Laterality Date   CHOLECYSTECTOMY     COLONOSCOPY     HERNIA REPAIR     KNEE SURGERY     RADICAL  ORCHIECTOMY Right    TONSILLECTOMY     Social History   Occupational History   Not on file  Tobacco Use   Smoking status: Never   Smokeless tobacco: Never  Vaping Use   Vaping Use: Never used  Substance and Sexual Activity   Alcohol use: No   Drug use: No   Sexual activity: Not on file

## 2020-07-28 NOTE — Progress Notes (Signed)
I saw and examined the patient with Dr. Elouise Munroe and agree with assessment and plan as outlined.    Overall improving, but not yet ready for his strenuous job.  Will keep oow until July 5 while advancing workouts in PT.  MRI if takes a turn for the worse.

## 2020-08-03 ENCOUNTER — Other Ambulatory Visit: Payer: BC Managed Care – PPO

## 2020-08-03 ENCOUNTER — Other Ambulatory Visit: Payer: Self-pay

## 2020-08-03 ENCOUNTER — Ambulatory Visit: Payer: BC Managed Care – PPO | Admitting: Oncology

## 2020-08-03 ENCOUNTER — Ambulatory Visit (INDEPENDENT_AMBULATORY_CARE_PROVIDER_SITE_OTHER): Payer: BC Managed Care – PPO | Admitting: Family Medicine

## 2020-08-03 DIAGNOSIS — M4316 Spondylolisthesis, lumbar region: Secondary | ICD-10-CM

## 2020-08-03 NOTE — Progress Notes (Signed)
Office Visit Note   Patient: Matthew Hatfield           Date of Birth: [birth year]           MRN: [id number] Visit Date: 08/03/2020 Requested by: Nicolette Bang, MD [address] [place],  [place] [address] PCP: Nicolette Bang, MD  Subjective: Chief Complaint  Patient presents with   Lower Back - Pain    Last Thursday, he woke up with severe pain - like he had when the back first popped. The next day, the he had pain into the left lower leg. The back has progressively gotten better, though - "today is the best my back has felt." Still has some pain into the hips. Has a new complaint of constipation. Has been passing gas. Was able to move his bowels "with force" today - very hard stool.    HPI: He is here with a flareup of low back pain.  This past Thursday he woke up with severe pain radiating into his left leg.  He did not feel a pop at this time, there is no specific injury.  The pain has gotten much better since then but he does have some constipation troubles.  He is very discouraged that his back keeps flaring up.  He is doing the exercises prescribed by physical therapy and is being very cautious about weight lifting.  He is out of work until July 5.  He is getting ready to fly in a couple days and he wanted to be sure it was okay.                ROS:   All other systems were reviewed and are negative.  Objective: Vital Signs: There were no vitals taken for this visit.  Physical Exam:  General:  Alert and oriented, in no acute distress. Pulm:  Breathing unlabored. Psy:  Normal mood, congruent affect  Low back: No significant tenderness to palpation of the lumbosacral spine today.  Straight leg raise negative.  Lower extremity strength and reflexes remain normal.    Imaging: No results found.  Assessment & Plan: Flareup of low back pain with left-sided sciatica, nonfocal neurologic exam. - We will proceed with MRI scan to further evaluate.  Okay  to fly using lifting precautions.     Procedures: No procedures performed        PMFS History: Patient Active Problem List   Diagnosis Date Noted   Spondylolisthesis, lumbar region 07/08/2020   Malignant neoplasm of base of tongue ([place]) 06/24/2020   Iron deficiency anemia 02/04/2020   Seborrheic keratosis 03/15/2016   Testicular cancer ([place]) 01/26/2015   Essential hypertension 08/19/2013   Seminoma ([place]) 08/06/2013   Past Medical History:  Diagnosis Date   Cancer of base of tongue (HCC)    GERD (gastroesophageal reflux disease)    Hyperlipidemia    Hypertension    Personal history of testicular cancer    Throat cancer ([place])     Family History  Problem Relation Age of Onset   Cancer Mother    Heart disease Mother    Lung cancer Father    Diabetes Maternal Uncle    Colon polyps Neg Hx    Colon cancer Neg Hx    Esophageal cancer Neg Hx    Stomach cancer Neg Hx    Rectal cancer Neg Hx     Past Surgical History:  Procedure Laterality Date   CHOLECYSTECTOMY     COLONOSCOPY  HERNIA REPAIR     KNEE SURGERY     RADICAL ORCHIECTOMY Right    TONSILLECTOMY     Social History   Occupational History   Not on file  Tobacco Use   Smoking status: Never   Smokeless tobacco: Never  Vaping Use   Vaping Use: Never used  Substance and Sexual Activity   Alcohol use: No   Drug use: No   Sexual activity: Not on file

## 2020-08-04 DIAGNOSIS — M5126 Other intervertebral disc displacement, lumbar region: Secondary | ICD-10-CM | POA: Diagnosis not present

## 2020-08-04 DIAGNOSIS — M545 Low back pain, unspecified: Secondary | ICD-10-CM | POA: Diagnosis not present

## 2020-08-11 DIAGNOSIS — M545 Low back pain, unspecified: Secondary | ICD-10-CM | POA: Diagnosis not present

## 2020-08-11 DIAGNOSIS — M5126 Other intervertebral disc displacement, lumbar region: Secondary | ICD-10-CM | POA: Diagnosis not present

## 2020-08-14 ENCOUNTER — Ambulatory Visit
Admission: RE | Admit: 2020-08-14 | Discharge: 2020-08-14 | Disposition: A | Discharge status: Home / Self Care | Payer: BC Managed Care – PPO | Source: Ambulatory Visit | Attending: Family Medicine | Admitting: Family Medicine

## 2020-08-14 ENCOUNTER — Other Ambulatory Visit: Payer: Self-pay

## 2020-08-14 DIAGNOSIS — M48061 Spinal stenosis, lumbar region without neurogenic claudication: Secondary | ICD-10-CM | POA: Diagnosis not present

## 2020-08-14 DIAGNOSIS — M4316 Spondylolisthesis, lumbar region: Secondary | ICD-10-CM

## 2020-08-14 DIAGNOSIS — M545 Low back pain, unspecified: Secondary | ICD-10-CM | POA: Diagnosis not present

## 2020-08-16 ENCOUNTER — Telehealth: Payer: Self-pay | Admitting: Family Medicine

## 2020-08-16 NOTE — Telephone Encounter (Signed)
MRI shows bulging discs at L2-3 and L3-4, but no nerve impingement.  At L5-S1, due to the defect in the vertebrae ("chronic L5 pars fractures") that we already knew about, there is moderate to severe narrowing of the nerve openings (foramen) at that level.  This is the most likely source of your symptoms.    As we've discussed, we always try to avoid surgery if possible.  The usual treatments that help are physical therapy, chiropractic, sometimes epidural steroid injections, sometimes back bracing.  Surgical consult when other treatments fail.

## 2020-08-17 ENCOUNTER — Telehealth: Payer: Self-pay | Admitting: Family Medicine

## 2020-08-18 DIAGNOSIS — M5126 Other intervertebral disc displacement, lumbar region: Secondary | ICD-10-CM | POA: Diagnosis not present

## 2020-08-18 DIAGNOSIS — M545 Low back pain, unspecified: Secondary | ICD-10-CM | POA: Diagnosis not present

## 2020-08-20 DIAGNOSIS — M5126 Other intervertebral disc displacement, lumbar region: Secondary | ICD-10-CM | POA: Diagnosis not present

## 2020-08-20 DIAGNOSIS — M545 Low back pain, unspecified: Secondary | ICD-10-CM | POA: Diagnosis not present

## 2020-08-23 DIAGNOSIS — M9903 Segmental and somatic dysfunction of lumbar region: Secondary | ICD-10-CM | POA: Diagnosis not present

## 2020-08-23 DIAGNOSIS — M9905 Segmental and somatic dysfunction of pelvic region: Secondary | ICD-10-CM | POA: Diagnosis not present

## 2020-08-23 DIAGNOSIS — M25552 Pain in left hip: Secondary | ICD-10-CM | POA: Diagnosis not present

## 2020-08-23 DIAGNOSIS — M5116 Intervertebral disc disorders with radiculopathy, lumbar region: Secondary | ICD-10-CM | POA: Diagnosis not present

## 2020-08-24 DIAGNOSIS — M9903 Segmental and somatic dysfunction of lumbar region: Secondary | ICD-10-CM | POA: Diagnosis not present

## 2020-08-24 DIAGNOSIS — M25552 Pain in left hip: Secondary | ICD-10-CM | POA: Diagnosis not present

## 2020-08-24 DIAGNOSIS — M5116 Intervertebral disc disorders with radiculopathy, lumbar region: Secondary | ICD-10-CM | POA: Diagnosis not present

## 2020-08-24 DIAGNOSIS — M9905 Segmental and somatic dysfunction of pelvic region: Secondary | ICD-10-CM | POA: Diagnosis not present

## 2020-08-25 ENCOUNTER — Other Ambulatory Visit: Payer: Self-pay

## 2020-08-25 ENCOUNTER — Encounter: Payer: Self-pay | Admitting: Family Medicine

## 2020-08-25 ENCOUNTER — Ambulatory Visit (INDEPENDENT_AMBULATORY_CARE_PROVIDER_SITE_OTHER): Payer: BC Managed Care – PPO | Admitting: Family Medicine

## 2020-08-25 DIAGNOSIS — M9905 Segmental and somatic dysfunction of pelvic region: Secondary | ICD-10-CM | POA: Diagnosis not present

## 2020-08-25 DIAGNOSIS — M25552 Pain in left hip: Secondary | ICD-10-CM | POA: Diagnosis not present

## 2020-08-25 DIAGNOSIS — M545 Low back pain, unspecified: Secondary | ICD-10-CM | POA: Diagnosis not present

## 2020-08-25 DIAGNOSIS — M4316 Spondylolisthesis, lumbar region: Secondary | ICD-10-CM

## 2020-08-25 DIAGNOSIS — M5116 Intervertebral disc disorders with radiculopathy, lumbar region: Secondary | ICD-10-CM | POA: Diagnosis not present

## 2020-08-25 DIAGNOSIS — M9903 Segmental and somatic dysfunction of lumbar region: Secondary | ICD-10-CM | POA: Diagnosis not present

## 2020-08-25 DIAGNOSIS — M5126 Other intervertebral disc displacement, lumbar region: Secondary | ICD-10-CM | POA: Diagnosis not present

## 2020-08-25 NOTE — Progress Notes (Signed)
Office Visit Note   Patient: Matthew Hatfield           Date of Birth: [birth year]           MRN: [id number] Visit Date: 08/25/2020 Requested by: Nicolette Bang, MD [address] [place],  [place] [address] PCP: Nicolette Bang, MD  Subjective: Chief Complaint  Patient presents with   Lower Back - Follow-up, Pain    Yesterday was his first day of decompression. Could hardlly put weight on the right leg. Went to PT today -- was extended out for another month. With decompression today, the pain eased a little. He says he has not been doing any heavy lifting and being careful at work (been back x 2 weeks). The patient wonders if he should be pulled back out for work until he has had more PT/decompression. Icing and using heat on his back. No longer taking Ibuprofen and tizanidine, but still has some.     HPI: He is here for follow-up low back pain with spondylolisthesis.  He has started physical therapy and is doing chiropractic decompression as well.  So far he has had a flareup, but is better now.  Because he has a lot of appointments scheduled through the end of August, he is finding it very difficult to attend his sessions during work hours.               ROS:   All other systems were reviewed and are negative.  Objective: Vital Signs: There were no vitals taken for this visit.  Physical Exam:  General:  Alert and oriented, in no acute distress. Pulm:  Breathing unlabored. Psy:  Normal mood, congruent affect.  Low back: Tender to palpation in the midline over the L4-5 level.  Lower extremity strength and reflexes remain normal.  Straight leg raise is negative.  Imaging: No results found.  Assessment & Plan: Low back pain with spondylolisthesis -Out of work until September 1 while he pursues treatments.     Procedures: No procedures performed        PMFS History: Patient Active Problem List   Diagnosis Date Noted   Spondylolisthesis, lumbar  region 07/08/2020   Malignant neoplasm of base of tongue ([place]) 06/24/2020   Iron deficiency anemia 02/04/2020   Seborrheic keratosis 03/15/2016   Testicular cancer ([place]) 01/26/2015   Essential hypertension 08/19/2013   Seminoma ([place]) 08/06/2013   Past Medical History:  Diagnosis Date   Cancer of base of tongue (HCC)    GERD (gastroesophageal reflux disease)    Hyperlipidemia    Hypertension    Personal history of testicular cancer    Throat cancer ([place])     Family History  Problem Relation Age of Onset   Cancer Mother    Heart disease Mother    Lung cancer Father    Diabetes Maternal Uncle    Colon polyps Neg Hx    Colon cancer Neg Hx    Esophageal cancer Neg Hx    Stomach cancer Neg Hx    Rectal cancer Neg Hx     Past Surgical History:  Procedure Laterality Date   CHOLECYSTECTOMY     COLONOSCOPY     HERNIA REPAIR     KNEE SURGERY     RADICAL ORCHIECTOMY Right    TONSILLECTOMY     Social History   Occupational History   Not on file  Tobacco Use   Smoking status: Never   Smokeless tobacco: Never  Vaping  Use   Vaping Use: Never used  Substance and Sexual Activity   Alcohol use: No   Drug use: No   Sexual activity: Not on file

## 2020-08-26 DIAGNOSIS — M9905 Segmental and somatic dysfunction of pelvic region: Secondary | ICD-10-CM | POA: Diagnosis not present

## 2020-08-26 DIAGNOSIS — M5116 Intervertebral disc disorders with radiculopathy, lumbar region: Secondary | ICD-10-CM | POA: Diagnosis not present

## 2020-08-26 DIAGNOSIS — M9903 Segmental and somatic dysfunction of lumbar region: Secondary | ICD-10-CM | POA: Diagnosis not present

## 2020-08-26 DIAGNOSIS — M25552 Pain in left hip: Secondary | ICD-10-CM | POA: Diagnosis not present

## 2020-08-27 DIAGNOSIS — M545 Low back pain, unspecified: Secondary | ICD-10-CM | POA: Diagnosis not present

## 2020-08-27 DIAGNOSIS — M5126 Other intervertebral disc displacement, lumbar region: Secondary | ICD-10-CM | POA: Diagnosis not present

## 2020-08-30 DIAGNOSIS — M5126 Other intervertebral disc displacement, lumbar region: Secondary | ICD-10-CM | POA: Diagnosis not present

## 2020-08-30 DIAGNOSIS — M9905 Segmental and somatic dysfunction of pelvic region: Secondary | ICD-10-CM | POA: Diagnosis not present

## 2020-08-30 DIAGNOSIS — M5116 Intervertebral disc disorders with radiculopathy, lumbar region: Secondary | ICD-10-CM | POA: Diagnosis not present

## 2020-08-30 DIAGNOSIS — M545 Low back pain, unspecified: Secondary | ICD-10-CM | POA: Diagnosis not present

## 2020-08-30 DIAGNOSIS — M25552 Pain in left hip: Secondary | ICD-10-CM | POA: Diagnosis not present

## 2020-08-30 DIAGNOSIS — M9903 Segmental and somatic dysfunction of lumbar region: Secondary | ICD-10-CM | POA: Diagnosis not present

## 2020-08-31 DIAGNOSIS — M25552 Pain in left hip: Secondary | ICD-10-CM | POA: Diagnosis not present

## 2020-08-31 DIAGNOSIS — M9905 Segmental and somatic dysfunction of pelvic region: Secondary | ICD-10-CM | POA: Diagnosis not present

## 2020-08-31 DIAGNOSIS — M5116 Intervertebral disc disorders with radiculopathy, lumbar region: Secondary | ICD-10-CM | POA: Diagnosis not present

## 2020-08-31 DIAGNOSIS — M9903 Segmental and somatic dysfunction of lumbar region: Secondary | ICD-10-CM | POA: Diagnosis not present

## 2020-09-01 DIAGNOSIS — M5126 Other intervertebral disc displacement, lumbar region: Secondary | ICD-10-CM | POA: Diagnosis not present

## 2020-09-01 DIAGNOSIS — M545 Low back pain, unspecified: Secondary | ICD-10-CM | POA: Diagnosis not present

## 2020-09-02 DIAGNOSIS — M9905 Segmental and somatic dysfunction of pelvic region: Secondary | ICD-10-CM | POA: Diagnosis not present

## 2020-09-02 DIAGNOSIS — M5116 Intervertebral disc disorders with radiculopathy, lumbar region: Secondary | ICD-10-CM | POA: Diagnosis not present

## 2020-09-02 DIAGNOSIS — M25552 Pain in left hip: Secondary | ICD-10-CM | POA: Diagnosis not present

## 2020-09-02 DIAGNOSIS — M9903 Segmental and somatic dysfunction of lumbar region: Secondary | ICD-10-CM | POA: Diagnosis not present

## 2020-09-06 DIAGNOSIS — M5116 Intervertebral disc disorders with radiculopathy, lumbar region: Secondary | ICD-10-CM | POA: Diagnosis not present

## 2020-09-06 DIAGNOSIS — M25552 Pain in left hip: Secondary | ICD-10-CM | POA: Diagnosis not present

## 2020-09-06 DIAGNOSIS — M9905 Segmental and somatic dysfunction of pelvic region: Secondary | ICD-10-CM | POA: Diagnosis not present

## 2020-09-06 DIAGNOSIS — M5126 Other intervertebral disc displacement, lumbar region: Secondary | ICD-10-CM | POA: Diagnosis not present

## 2020-09-06 DIAGNOSIS — M9903 Segmental and somatic dysfunction of lumbar region: Secondary | ICD-10-CM | POA: Diagnosis not present

## 2020-09-06 DIAGNOSIS — M545 Low back pain, unspecified: Secondary | ICD-10-CM | POA: Diagnosis not present

## 2020-09-07 DIAGNOSIS — M9903 Segmental and somatic dysfunction of lumbar region: Secondary | ICD-10-CM | POA: Diagnosis not present

## 2020-09-07 DIAGNOSIS — M25552 Pain in left hip: Secondary | ICD-10-CM | POA: Diagnosis not present

## 2020-09-07 DIAGNOSIS — M5116 Intervertebral disc disorders with radiculopathy, lumbar region: Secondary | ICD-10-CM | POA: Diagnosis not present

## 2020-09-07 DIAGNOSIS — M9905 Segmental and somatic dysfunction of pelvic region: Secondary | ICD-10-CM | POA: Diagnosis not present

## 2020-09-08 DIAGNOSIS — M5126 Other intervertebral disc displacement, lumbar region: Secondary | ICD-10-CM | POA: Diagnosis not present

## 2020-09-08 DIAGNOSIS — M545 Low back pain, unspecified: Secondary | ICD-10-CM | POA: Diagnosis not present

## 2020-09-09 DIAGNOSIS — M9905 Segmental and somatic dysfunction of pelvic region: Secondary | ICD-10-CM | POA: Diagnosis not present

## 2020-09-09 DIAGNOSIS — M5116 Intervertebral disc disorders with radiculopathy, lumbar region: Secondary | ICD-10-CM | POA: Diagnosis not present

## 2020-09-09 DIAGNOSIS — M25552 Pain in left hip: Secondary | ICD-10-CM | POA: Diagnosis not present

## 2020-09-09 DIAGNOSIS — M9903 Segmental and somatic dysfunction of lumbar region: Secondary | ICD-10-CM | POA: Diagnosis not present

## 2020-09-13 DIAGNOSIS — M9905 Segmental and somatic dysfunction of pelvic region: Secondary | ICD-10-CM | POA: Diagnosis not present

## 2020-09-13 DIAGNOSIS — M9903 Segmental and somatic dysfunction of lumbar region: Secondary | ICD-10-CM | POA: Diagnosis not present

## 2020-09-13 DIAGNOSIS — M545 Low back pain, unspecified: Secondary | ICD-10-CM | POA: Diagnosis not present

## 2020-09-13 DIAGNOSIS — M5126 Other intervertebral disc displacement, lumbar region: Secondary | ICD-10-CM | POA: Diagnosis not present

## 2020-09-13 DIAGNOSIS — M25552 Pain in left hip: Secondary | ICD-10-CM | POA: Diagnosis not present

## 2020-09-13 DIAGNOSIS — M5116 Intervertebral disc disorders with radiculopathy, lumbar region: Secondary | ICD-10-CM | POA: Diagnosis not present

## 2020-09-16 DIAGNOSIS — M9905 Segmental and somatic dysfunction of pelvic region: Secondary | ICD-10-CM | POA: Diagnosis not present

## 2020-09-16 DIAGNOSIS — M5116 Intervertebral disc disorders with radiculopathy, lumbar region: Secondary | ICD-10-CM | POA: Diagnosis not present

## 2020-09-16 DIAGNOSIS — M9903 Segmental and somatic dysfunction of lumbar region: Secondary | ICD-10-CM | POA: Diagnosis not present

## 2020-09-16 DIAGNOSIS — M25552 Pain in left hip: Secondary | ICD-10-CM | POA: Diagnosis not present

## 2020-09-17 DIAGNOSIS — M5126 Other intervertebral disc displacement, lumbar region: Secondary | ICD-10-CM | POA: Diagnosis not present

## 2020-09-17 DIAGNOSIS — M545 Low back pain, unspecified: Secondary | ICD-10-CM | POA: Diagnosis not present

## 2020-09-20 DIAGNOSIS — M9903 Segmental and somatic dysfunction of lumbar region: Secondary | ICD-10-CM | POA: Diagnosis not present

## 2020-09-20 DIAGNOSIS — M25552 Pain in left hip: Secondary | ICD-10-CM | POA: Diagnosis not present

## 2020-09-20 DIAGNOSIS — M9905 Segmental and somatic dysfunction of pelvic region: Secondary | ICD-10-CM | POA: Diagnosis not present

## 2020-09-20 DIAGNOSIS — M5116 Intervertebral disc disorders with radiculopathy, lumbar region: Secondary | ICD-10-CM | POA: Diagnosis not present

## 2020-09-23 DIAGNOSIS — M9905 Segmental and somatic dysfunction of pelvic region: Secondary | ICD-10-CM | POA: Diagnosis not present

## 2020-09-23 DIAGNOSIS — M5116 Intervertebral disc disorders with radiculopathy, lumbar region: Secondary | ICD-10-CM | POA: Diagnosis not present

## 2020-09-23 DIAGNOSIS — M9903 Segmental and somatic dysfunction of lumbar region: Secondary | ICD-10-CM | POA: Diagnosis not present

## 2020-09-23 DIAGNOSIS — M25552 Pain in left hip: Secondary | ICD-10-CM | POA: Diagnosis not present

## 2020-09-25 DIAGNOSIS — I10 Essential (primary) hypertension: Secondary | ICD-10-CM | POA: Diagnosis not present

## 2020-09-25 DIAGNOSIS — M7989 Other specified soft tissue disorders: Secondary | ICD-10-CM | POA: Diagnosis not present

## 2020-09-25 DIAGNOSIS — R600 Localized edema: Secondary | ICD-10-CM | POA: Diagnosis not present

## 2020-09-25 DIAGNOSIS — M79605 Pain in left leg: Secondary | ICD-10-CM | POA: Diagnosis not present

## 2020-09-25 DIAGNOSIS — R2243 Localized swelling, mass and lump, lower limb, bilateral: Secondary | ICD-10-CM | POA: Diagnosis not present

## 2020-09-25 DIAGNOSIS — M79604 Pain in right leg: Secondary | ICD-10-CM | POA: Diagnosis not present

## 2020-09-26 DIAGNOSIS — M79604 Pain in right leg: Secondary | ICD-10-CM | POA: Diagnosis not present

## 2020-09-26 DIAGNOSIS — M7989 Other specified soft tissue disorders: Secondary | ICD-10-CM | POA: Diagnosis not present

## 2020-09-26 DIAGNOSIS — M79605 Pain in left leg: Secondary | ICD-10-CM | POA: Diagnosis not present

## 2020-09-26 DIAGNOSIS — R600 Localized edema: Secondary | ICD-10-CM | POA: Diagnosis not present

## 2020-09-27 DIAGNOSIS — M5116 Intervertebral disc disorders with radiculopathy, lumbar region: Secondary | ICD-10-CM | POA: Diagnosis not present

## 2020-09-27 DIAGNOSIS — M25552 Pain in left hip: Secondary | ICD-10-CM | POA: Diagnosis not present

## 2020-09-27 DIAGNOSIS — M9905 Segmental and somatic dysfunction of pelvic region: Secondary | ICD-10-CM | POA: Diagnosis not present

## 2020-09-27 DIAGNOSIS — M9903 Segmental and somatic dysfunction of lumbar region: Secondary | ICD-10-CM | POA: Diagnosis not present

## 2020-10-04 DIAGNOSIS — M5116 Intervertebral disc disorders with radiculopathy, lumbar region: Secondary | ICD-10-CM | POA: Diagnosis not present

## 2020-10-04 DIAGNOSIS — M9905 Segmental and somatic dysfunction of pelvic region: Secondary | ICD-10-CM | POA: Diagnosis not present

## 2020-10-04 DIAGNOSIS — M25552 Pain in left hip: Secondary | ICD-10-CM | POA: Diagnosis not present

## 2020-10-04 DIAGNOSIS — M9903 Segmental and somatic dysfunction of lumbar region: Secondary | ICD-10-CM | POA: Diagnosis not present

## 2020-10-05 ENCOUNTER — Ambulatory Visit: Payer: BC Managed Care – PPO | Admitting: Family Medicine

## 2020-10-05 ENCOUNTER — Encounter: Payer: Self-pay | Admitting: Family Medicine

## 2020-10-05 ENCOUNTER — Other Ambulatory Visit: Payer: Self-pay

## 2020-10-05 VITALS — BP 126/77 | HR 66 | Temp 97.5°F | Resp 22 | Ht 70.0 in | Wt 258.0 lb

## 2020-10-05 DIAGNOSIS — R600 Localized edema: Secondary | ICD-10-CM

## 2020-10-05 DIAGNOSIS — I10 Essential (primary) hypertension: Secondary | ICD-10-CM

## 2020-10-05 NOTE — Progress Notes (Signed)
Medication RF: all meds

## 2020-10-05 NOTE — Progress Notes (Signed)
Established Patient Office Visit  Subjective:  Patient ID: Matthew Hatfield, male    DOB: [birth year]  Age: 55 y.o. MRN: [id number]  CC:  Chief Complaint  Patient presents with   Hospitalization Follow-up   Medication Refill    HPI Matthew Hatfield presents for follow up of ED visit. Patient was seen at [facility] for swelling of his legs bilaterally. Negative for PE. 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 He was given Keflex and sx have resolved.   Past Medical History:  Diagnosis Date   Cancer of base of tongue (HCC)    GERD (gastroesophageal reflux disease)    Hyperlipidemia    Hypertension    Personal history of testicular cancer    Throat cancer ([place])     Past Surgical History:  Procedure Laterality Date   CHOLECYSTECTOMY     COLONOSCOPY     HERNIA REPAIR     KNEE SURGERY     RADICAL ORCHIECTOMY Right    TONSILLECTOMY      Family History  Problem Relation Age of Onset   Cancer Mother    Heart disease Mother    Lung cancer Father    Diabetes Maternal Uncle    Colon polyps Neg Hx    Colon cancer Neg Hx    Esophageal cancer Neg Hx    Stomach cancer Neg Hx    Rectal cancer Neg Hx     Social History   Socioeconomic History   Marital status: Widowed    Spouse name: Not on file   Number of children: Not on file   Years of education: Not on file   Highest education level: Not on file  Occupational History   Not on file  Tobacco Use   Smoking status: Never   Smokeless tobacco: Never  Vaping Use   Vaping Use: Never used  Substance and Sexual Activity   Alcohol use: No   Drug use: No   Sexual activity: Not on file  Other Topics Concern   Not on file  Social History Narrative   Not on file   Social Determinants of Health   Financial Resource Strain: Not on file  Food Insecurity: Not on file  Transportation Needs: Not on file  Physical Activity: Not on file  Stress: Not on file  Social Connections:  Not on file  Intimate Partner Violence: Not on file    Outpatient Medications Prior to Visit  Medication Sig Dispense Refill   amLODipine (NORVASC) 10 MG tablet TAKE 1 TABLET BY MOUTH EVERY DAY 90 tablet 0   aspirin 81 MG chewable tablet Chew 81 mg by mouth.     atorvastatin (LIPITOR) 40 MG tablet Take 1 tablet (40 mg total) by mouth daily. 90 tablet 3   hydrochlorothiazide (HYDRODIURIL) 25 MG tablet TAKE 1 TABLET BY MOUTH EVERY DAY 90 tablet 0   hydrOXYzine (ATARAX/VISTARIL) 25 MG tablet TAKE 1 TABLET BY MOUTH EVERY 6 HOURS. 60 tablet 0   ibuprofen (ADVIL) 600 MG tablet Take 1 tablet (600 mg total) by mouth every 6 (six) hours as needed. 30 tablet 0   lisinopril (ZESTRIL) 10 MG tablet TAKE 1 TABLET BY MOUTH EVERY DAY 90 tablet 1   tiZANidine (ZANAFLEX) 4 MG tablet Take 1 tablet (4 mg total) by mouth every 8 (eight) hours as needed for muscle spasms. (Patient not taking: Reported on 08/25/2020) 30 tablet 0   No facility-administered medications prior to visit.  Allergies  Allergen Reactions   Other Hives and Other (See Comments)    Cat and dog dander Racing heart   Lidocaine Swelling    Pt has used magic mouthwash and viscous lidocaine with oral swelling   Shellfish-Derived Products Hives   Antihistamines, Diphenhydramine-Type Palpitations   Feraheme [Ferumoxytol] Hives, Itching and Rash    Pt had redness of upper back and arms with itching.  Also had raised hives on upper arm.  Treated with pepcid 20 mg and diphenhydramine 12.'5mg'$ .    ROS Review of Systems  Cardiovascular:  Positive for leg swelling. Negative for chest pain.  All other systems reviewed and are negative.    Objective:    Physical Exam Vitals and nursing note reviewed.  Constitutional:      General: He is not in acute distress. Cardiovascular:     Rate and Rhythm: Normal rate and regular rhythm.  Pulmonary:     Effort: Pulmonary effort is normal.     Breath sounds: Normal breath sounds.  Neurological:      General: No focal deficit present.     Mental Status: He is alert and oriented to person, place, and time.    BP 126/77   Pulse 66   Temp (!) 97.5 F (36.4 C)   Resp (!) 22   Ht '5\' 10"'$  (1.778 m)   Wt 258 lb (117 kg)   SpO2 95%   BMI 37.02 kg/m  Wt Readings from Last 3 Encounters:  10/05/20 258 lb (117 kg)  06/25/20 251 lb 4.8 oz (114 kg)  06/22/20 246 lb (111.6 kg)     Health Maintenance Due  Topic Date Due   COVID-19 Vaccine (1) Never done   Pneumococcal Vaccine 79-11 Years old (1 - PCV) Never done   Zoster Vaccines- Shingrix (1 of 2) Never done   COLONOSCOPY (Pts 45-45yr Insurance coverage will need to be confirmed)  Never done   INFLUENZA VACCINE  09/13/2020    There are no preventive care reminders to display for this patient.        Assessment & Plan:   1. Essential hypertension Appears stable - continue present management  2. Bilateral leg edema ? 2/2 allergic reaction to topical prep - Has resolved with present management   Follow-up: Return in about 6 months (around 04/07/2021) for follow up, chronic med issues.    WBecky Sax MD

## 2020-10-08 ENCOUNTER — Telehealth: Payer: Self-pay | Admitting: Family Medicine

## 2020-10-08 ENCOUNTER — Encounter: Payer: Self-pay | Admitting: Family Medicine

## 2020-10-08 NOTE — Telephone Encounter (Signed)
The patient has finished with PT and is feeling 100% better. He is currently out of work through 10/14/20. He would like to return to work on 10/15/20, without restriction. Would like to pick up a note on Monday.

## 2020-10-08 NOTE — Telephone Encounter (Signed)
Pt called requesting a return to work note. Please call pt about this matter at [phone].

## 2020-10-08 NOTE — Telephone Encounter (Signed)
Placed letter downstairs for pickup.

## 2020-10-11 DIAGNOSIS — M9903 Segmental and somatic dysfunction of lumbar region: Secondary | ICD-10-CM | POA: Diagnosis not present

## 2020-10-11 DIAGNOSIS — M9905 Segmental and somatic dysfunction of pelvic region: Secondary | ICD-10-CM | POA: Diagnosis not present

## 2020-10-11 DIAGNOSIS — M25552 Pain in left hip: Secondary | ICD-10-CM | POA: Diagnosis not present

## 2020-10-11 DIAGNOSIS — M5116 Intervertebral disc disorders with radiculopathy, lumbar region: Secondary | ICD-10-CM | POA: Diagnosis not present

## 2020-10-19 DIAGNOSIS — M25552 Pain in left hip: Secondary | ICD-10-CM | POA: Diagnosis not present

## 2020-10-19 DIAGNOSIS — M9903 Segmental and somatic dysfunction of lumbar region: Secondary | ICD-10-CM | POA: Diagnosis not present

## 2020-10-19 DIAGNOSIS — M9905 Segmental and somatic dysfunction of pelvic region: Secondary | ICD-10-CM | POA: Diagnosis not present

## 2020-10-19 DIAGNOSIS — M5116 Intervertebral disc disorders with radiculopathy, lumbar region: Secondary | ICD-10-CM | POA: Diagnosis not present

## 2020-10-21 ENCOUNTER — Other Ambulatory Visit: Payer: Self-pay | Admitting: Family Medicine

## 2020-10-21 DIAGNOSIS — I10 Essential (primary) hypertension: Secondary | ICD-10-CM

## 2020-10-21 MED ORDER — AMLODIPINE BESYLATE 10 MG PO TABS: 10.0000 mg | ORAL_TABLET | Freq: Every day | ORAL | 0 refills | Status: DC

## 2020-10-21 MED ORDER — HYDROCHLOROTHIAZIDE 25 MG PO TABS: 25.0000 mg | ORAL_TABLET | Freq: Every day | ORAL | 0 refills | Status: DC

## 2020-10-21 NOTE — Addendum Note (Signed)
Addended by: Carilyn Goodpasture on: 10/21/2020 10:42 AM   Modules accepted: Orders

## 2020-10-21 NOTE — Telephone Encounter (Signed)
Please refill if appropriate. Last OV  10/05/2020

## 2020-10-21 NOTE — Telephone Encounter (Signed)
Patient called in stating he needs refills on his medications  hydrochlorothiazide (HYDRODIURIL) 25 MG tablet  amLODipine (NORVASC) 10 MG tablet  hydrOXYzine (ATARAX/VISTARIL) 25 MG tablet     Pharmacy  [id number] [address] [place] [address] Phone:  [phone] Fax:  [phone] DEA #:  [id number]

## 2020-10-23 MED ORDER — HYDROXYZINE HCL 25 MG PO TABS: 25.0000 mg | ORAL_TABLET | Freq: Four times a day (QID) | ORAL | 0 refills | Status: DC

## 2020-10-27 ENCOUNTER — Other Ambulatory Visit: Payer: Self-pay

## 2020-10-27 ENCOUNTER — Encounter: Payer: Self-pay | Admitting: Orthopaedic Surgery

## 2020-10-27 ENCOUNTER — Ambulatory Visit (INDEPENDENT_AMBULATORY_CARE_PROVIDER_SITE_OTHER): Payer: BC Managed Care – PPO | Admitting: Orthopaedic Surgery

## 2020-10-27 VITALS — Ht 70.0 in | Wt 252.0 lb

## 2020-10-27 DIAGNOSIS — M4316 Spondylolisthesis, lumbar region: Secondary | ICD-10-CM

## 2020-10-27 DIAGNOSIS — R600 Localized edema: Secondary | ICD-10-CM | POA: Diagnosis not present

## 2020-10-27 DIAGNOSIS — M25552 Pain in left hip: Secondary | ICD-10-CM | POA: Diagnosis not present

## 2020-10-27 DIAGNOSIS — M5116 Intervertebral disc disorders with radiculopathy, lumbar region: Secondary | ICD-10-CM | POA: Diagnosis not present

## 2020-10-27 DIAGNOSIS — M9903 Segmental and somatic dysfunction of lumbar region: Secondary | ICD-10-CM | POA: Diagnosis not present

## 2020-10-27 DIAGNOSIS — M9905 Segmental and somatic dysfunction of pelvic region: Secondary | ICD-10-CM | POA: Diagnosis not present

## 2020-10-27 HISTORY — DX: Localized edema: R60.0

## 2020-10-27 NOTE — Progress Notes (Signed)
Office Visit Note   Patient: Matthew Hatfield           Date of Birth: Oct 08, 1965           MRN: [id number] Visit Date: 10/27/2020              Requested by: Dorna Mai, [address] [place],  [place] [address] PCP: Dorna Mai, MD   Assessment & Plan: Visit Diagnoses:  1. Spondylolisthesis, lumbar region   2. Bilateral lower extremity edema     Plan: Matthew Hatfield is a former patient of Dr. Junius Roads.  He was out of work from May until September reference to his lumbar spine.  He has had an MRI scan revealing degenerative changes and a spondylolisthesis at L5-S1.  Had a course of physical therapy, chiropractic treatment and even injection by Dr. Ernestina Patches with excellent relief.  He went back to work on 9 September.  He is not having any back pain or referred pain to either lower extremity but he did develop some swelling in both of his legs.  He went to the emergency room and they did a full evaluation including a Doppler, renal function and an EKG.  All were normal.  He has been wearing support stockings and relates that he is really not having any significant problems or pain just minimal swelling.  I could not find an obvious source of his pitting edema but suggested he might want to check with his family physician for further evaluation.  His back is not an issue at this point and I do not think is causing his legs to swell.  He did not seem to have any varicosities and an excellent pulses  Follow-Up Instructions: Return if symptoms worsen or fail to improve.   Orders:  No orders of the defined types were placed in this encounter.  No orders of the defined types were placed in this encounter.     Procedures: No procedures performed   Clinical Data: No additional findings.   Subjective: Chief Complaint  Patient presents with   bilateral lower extremity swelling  Patient has seen Dr. Junius Roads previously for low back pain. He has undergone decompression treatment  with [facility] with good relief. He went back to work with no problems. He then noticed some swelling in both lower extremities and was seen in the ED. After testing, it was deemed that he had an allergic reaction and he was put on antibiotics. The swelling got better. He has recently noticed swelling again. He is wearing TED hose.  HPI  Review of Systems   Objective: Vital Signs: Ht '5\' 10"'$  (1.778 m)   Wt 252 lb (114.3 kg)   BMI 36.16 kg/m   Physical Exam Constitutional:      Appearance: He is well-developed.  Eyes:     Pupils: Pupils are equal, round, and reactive to light.  Pulmonary:     Effort: Pulmonary effort is normal.  Skin:    General: Skin is warm and dry.  Neurological:     Mental Status: He is alert and oriented to person, place, and time.  Psychiatric:        Behavior: Behavior normal.    Ortho Exam awake alert and oriented x3.  Comfortable sitting.  Straight leg raise negative bilaterally.  Painless range of motion both hips.  He had knee-high support stockings in place.  These were removed he had very minimal pitting edema from the mid leg to the ankles.  No foot swelling.  Excellent pulses and good capillary refill.  No calf pain.  No evidence of varicose veins.  No knee pain.  Straight leg raise negative.  Able to walk without a limp and related no back discomfort.  Specialty Comments:  No specialty comments available.  Imaging: No results found.   PMFS History: Patient Active Problem List   Diagnosis Date Noted   Bilateral lower extremity edema 10/27/2020   Spondylolisthesis, lumbar region 07/08/2020   Malignant neoplasm of base of tongue ([place]) 06/24/2020   Iron deficiency anemia 02/04/2020   Seborrheic keratosis 03/15/2016   Testicular cancer ([place]) 01/26/2015   Essential hypertension 08/19/2013   Seminoma ([place]) 08/06/2013   Past Medical History:  Diagnosis Date   Cancer of base of tongue (HCC)    GERD (gastroesophageal reflux disease)     Hyperlipidemia    Hypertension    Personal history of testicular cancer    Throat cancer ([place])     Family History  Problem Relation Age of Onset   Cancer Mother    Heart disease Mother    Lung cancer Father    Diabetes Maternal Uncle    Colon polyps Neg Hx    Colon cancer Neg Hx    Esophageal cancer Neg Hx    Stomach cancer Neg Hx    Rectal cancer Neg Hx     Past Surgical History:  Procedure Laterality Date   CHOLECYSTECTOMY     COLONOSCOPY     HERNIA REPAIR     KNEE SURGERY     RADICAL ORCHIECTOMY Right    TONSILLECTOMY     Social History   Occupational History   Not on file  Tobacco Use   Smoking status: Never   Smokeless tobacco: Never  Vaping Use   Vaping Use: Never used  Substance and Sexual Activity   Alcohol use: No   Drug use: No   Sexual activity: Not on file

## 2020-11-01 ENCOUNTER — Ambulatory Visit: Payer: BC Managed Care – PPO | Admitting: Nurse Practitioner

## 2020-11-02 ENCOUNTER — Ambulatory Visit: Payer: BC Managed Care – PPO | Admitting: Family Medicine

## 2020-11-24 ENCOUNTER — Other Ambulatory Visit: Payer: Self-pay | Admitting: *Deleted

## 2020-11-24 ENCOUNTER — Other Ambulatory Visit: Payer: Self-pay | Admitting: Internal Medicine

## 2020-11-24 DIAGNOSIS — I10 Essential (primary) hypertension: Secondary | ICD-10-CM

## 2020-11-24 DIAGNOSIS — Z20822 Contact with and (suspected) exposure to covid-19: Secondary | ICD-10-CM | POA: Diagnosis not present

## 2020-11-24 DIAGNOSIS — J069 Acute upper respiratory infection, unspecified: Secondary | ICD-10-CM | POA: Diagnosis not present

## 2020-11-24 DIAGNOSIS — R059 Cough, unspecified: Secondary | ICD-10-CM | POA: Diagnosis not present

## 2020-11-24 MED ORDER — LISINOPRIL 10 MG PO TABS: 10.0000 mg | ORAL_TABLET | Freq: Every day | ORAL | 0 refills | Status: DC

## 2020-12-27 NOTE — Progress Notes (Signed)
[facility]  [address] [place],  [place]  [address] [phone]  Clinic Day:  01/03/2021  Referring physician: Caryl Never*  This document serves as a record of services personally performed by [name], MD. It was created on their behalf by [facility], a trained medical scribe. The creation of this record is based on the scribe's personal observations and the provider's statements to them.  HISTORY OF PRESENT ILLNESS:  The patient is a 55 y.o. 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  He completed concurrent chemoradiation, which consisted of weekly carboplatin/paclitaxel, in May 2019.  All scans and physical exams since then have shown no signs of disease recurrence.  Of note, he was also found to have iron deficiency anemia, for which he received IV Feraheme.  He comes in today routine follow up.  Since his last visit, the patient has been doing well.  He denies having any new oral symptoms which concern him for recurrent base of tongue cancer.    Of note, he also has a history of seminoma, for which he underwent a orchiectomy in 2008, followed by BEP chemotherapy, which was completed in 2009.  Scans and exams since then never showed signs of disease recurrence.  He has been considered cured of this disease.    PHYSICAL EXAM:  Blood pressure (!) 170/88, pulse 68, temperature 98.1 F (36.7 C), resp. rate 18, height 5\' 10"  (1.778 m), weight 259 lb 12.8 oz (117.8 kg), SpO2 98 %. Wt Readings from Last 3 Encounters:  01/03/21 259 lb 12.8 oz (117.8 kg)  10/27/20 252 lb (114.3 kg)  10/05/20 258 lb (117 kg)   Body mass index is 37.28 kg/m. Performance status (ECOG): 0 Physical Exam Constitutional:      Appearance: Normal appearance. He is not ill-appearing.  HENT:     Mouth/Throat:     Mouth: Mucous membranes are moist.     Pharynx: Oropharynx is clear. No  oropharyngeal exudate or posterior oropharyngeal erythema.  Cardiovascular:     Rate and Rhythm: Normal rate and regular rhythm.     Heart sounds: No murmur heard.   No friction rub. No gallop.  Pulmonary:     Effort: Pulmonary effort is normal. No respiratory distress.     Breath sounds: Normal breath sounds. No wheezing, rhonchi or rales.  Abdominal:     General: Bowel sounds are normal. There is no distension.     Palpations: Abdomen is soft. There is no mass.     Tenderness: There is no abdominal tenderness.  Musculoskeletal:        General: No swelling.     Right lower leg: No edema.     Left lower leg: No edema.  Lymphadenopathy:     Cervical: No cervical adenopathy.     Upper Body:     Right upper body: No supraclavicular or axillary adenopathy.     Left upper body: No supraclavicular or axillary adenopathy.     Lower Body: No right inguinal adenopathy. No left inguinal adenopathy.  Skin:    General: Skin is warm.     Coloration: Skin is not jaundiced.     Findings: No lesion or rash.  Neurological:     General: No focal deficit present.     Mental Status: He is alert and oriented to person, place, and time. Mental status is at baseline.  Psychiatric:  Mood and Affect: Mood normal.        Behavior: Behavior normal.        Thought Content: Thought content normal.   LABS:  Latest Reference Range & Units 01/03/21 00:00  Sodium 137 - 147  137 (E)  Potassium 3.4 - 5.3  3.1 ! (E)  Chloride 99 - 108  100 (E)  CO2 13 - 22  31 ! (E)  Glucose  207 (E)  BUN 4 - 21  18 (E)  Creatinine 0.6 - 1.3  0.8 (E)  Calcium 8.7 - 10.7  8.2 ! (E)  Alkaline Phosphatase 25 - 125  76 (E)  Albumin 3.5 - 5.0  4.0 (E)  AST 14 - 40  23 (E)  ALT 10 - 40  19 (E)  Bilirubin, Total  0.3 (E)     Latest Reference Range & Units 01/03/21 10:20  Iron 45 - 182 ug/dL 16 (L)  UIBC ug/dL 454  TIBC 250 - 450 ug/dL 470 (H)  Saturation Ratios 17.9 - 39.5 % 3 (L)  Ferritin 24 - 336 ng/mL 4 (L)    ASSESSMENT & PLAN:  Assessment/Plan:  A 55 y.o. 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  Based upon his physical exam today, the patient remains disease free.  However, this gentleman's anemia has become very prominent again.  His labs point again to iron deficiency anemia being present to where he will take ferrous sulfate 325 mg PO TID.  Of note, he has had an allergic reaction to IV iron in the past.  If he has not had one recently, I will arrange for him to be seen by GI to have studies done to ensure there is no occult GI tract pathology behind his recurrent iron deficiency anemia.  Otherwise, I will see him back in 4 months for repeat clinical assessment.  The patient understands all the plans discussed today and is in agreement with them.    I, Rita Ohara, am acting as scribe for [name], MD    I have reviewed this report as typed by the medical scribe, and it is complete and accurate.  Styles Fambro Macarthur Critchley, MD

## 2021-01-03 ENCOUNTER — Inpatient Hospital Stay: Payer: BC Managed Care – PPO | Attending: Oncology

## 2021-01-03 ENCOUNTER — Other Ambulatory Visit: Payer: Self-pay | Admitting: Hematology and Oncology

## 2021-01-03 ENCOUNTER — Other Ambulatory Visit: Payer: Self-pay | Admitting: Oncology

## 2021-01-03 ENCOUNTER — Inpatient Hospital Stay (INDEPENDENT_AMBULATORY_CARE_PROVIDER_SITE_OTHER): Payer: BC Managed Care – PPO | Admitting: Oncology

## 2021-01-03 ENCOUNTER — Telehealth: Payer: Self-pay | Admitting: Oncology

## 2021-01-03 ENCOUNTER — Telehealth: Payer: Self-pay

## 2021-01-03 VITALS — BP 170/88 | HR 68 | Temp 98.1°F | Resp 18 | Ht 70.0 in | Wt 259.8 lb

## 2021-01-03 DIAGNOSIS — Z79899 Other long term (current) drug therapy: Secondary | ICD-10-CM | POA: Insufficient documentation

## 2021-01-03 DIAGNOSIS — Z9221 Personal history of antineoplastic chemotherapy: Secondary | ICD-10-CM | POA: Diagnosis not present

## 2021-01-03 DIAGNOSIS — Z923 Personal history of irradiation: Secondary | ICD-10-CM | POA: Diagnosis not present

## 2021-01-03 DIAGNOSIS — C01 Malignant neoplasm of base of tongue: Secondary | ICD-10-CM

## 2021-01-03 DIAGNOSIS — D509 Iron deficiency anemia, unspecified: Secondary | ICD-10-CM | POA: Insufficient documentation

## 2021-01-03 DIAGNOSIS — Z85810 Personal history of malignant neoplasm of tongue: Secondary | ICD-10-CM | POA: Insufficient documentation

## 2021-01-03 DIAGNOSIS — D508 Other iron deficiency anemias: Secondary | ICD-10-CM

## 2021-01-03 LAB — HEPATIC FUNCTION PANEL
ALT: 19 (ref 10–40)
AST: 23 (ref 14–40)
Alkaline Phosphatase: 76 (ref 25–125)
Bilirubin, Total: 0.3

## 2021-01-03 LAB — TSH: TSH: 6.251 u[IU]/mL — ABNORMAL HIGH (ref 0.350–4.500)

## 2021-01-03 LAB — CBC AND DIFFERENTIAL
HCT: 28 — AB (ref 41–53)
Hemoglobin: 8.4 — AB (ref 13.5–17.5)
Neutrophils Absolute: 4.82
Platelets: 317 (ref 150–399)
WBC: 6.6

## 2021-01-03 LAB — COMPREHENSIVE METABOLIC PANEL
Albumin: 4 (ref 3.5–5.0)
Calcium: 8.2 — AB (ref 8.7–10.7)

## 2021-01-03 LAB — IRON AND TIBC
Iron: 16 ug/dL — ABNORMAL LOW (ref 45–182)
Saturation Ratios: 3 % — ABNORMAL LOW (ref 17.9–39.5)
TIBC: 470 ug/dL — ABNORMAL HIGH (ref 250–450)
UIBC: 454 ug/dL

## 2021-01-03 LAB — BASIC METABOLIC PANEL
BUN: 18 (ref 4–21)
CO2: 31 — AB (ref 13–22)
Chloride: 100 (ref 99–108)
Creatinine: 0.8 (ref 0.6–1.3)
Glucose: 207
Potassium: 3.1 — AB (ref 3.4–5.3)
Sodium: 137 (ref 137–147)

## 2021-01-03 LAB — FERRITIN: Ferritin: 4 ng/mL — ABNORMAL LOW (ref 24–336)

## 2021-01-03 LAB — CBC: RBC: 4.56 (ref 3.87–5.11)

## 2021-01-03 NOTE — Telephone Encounter (Signed)
Per 11/21 los next appt scheduled and given to patient 

## 2021-01-03 NOTE — Telephone Encounter (Signed)
Dr Bobby Rumpf asked me to call pt, and tell him he needs IV iron. He also wants to know if pt has noted any bleeding in urine, BM, or anywhere? Also, when was pt's last GI workup?  I spoke with pt. & told him that Dr Bobby Rumpf recommends IV iron. Pt declines, and states, "the last time I took IV iron, I had a major reaction". Pt is willing to take oral iron. Pt denies bleeding anywhere. The last time he tried to schedule a colonoscopy, they wouldn't allow it because he doesn't have anyone to take him home and stay with him. Pt lives alone.  I notified Dr Bobby Rumpf of above. He wants pt to take ferrous sulfate 325 mg po TID. He wants pt to understand it is imperative that he gets a GI work up soon.  @1636 - I spoke with pt and notified him that Dr Bobby Rumpf wants him to take oral iron TID. I warned pt that iron can cause constipation. He will increase po fluids and add more stool softener daily. Pt understands and agrees that he needs GI workup, but he will have to drive himself home.

## 2021-01-04 ENCOUNTER — Other Ambulatory Visit: Payer: Self-pay

## 2021-01-04 ENCOUNTER — Encounter: Payer: Self-pay | Admitting: Oncology

## 2021-01-14 ENCOUNTER — Telehealth: Payer: Self-pay | Admitting: Family Medicine

## 2021-01-14 NOTE — Telephone Encounter (Signed)
Pt states he's needing refills for the following: (Pt has about 10 days left but calling ahead of time for processing time)  lisinopril (ZESTRIL) 10 MG tablet [[id number]]  hydrOXYzine (ATARAX/VISTARIL) 25 MG tablet [[id number]]   amLODipine (NORVASC) 10 MG tablet [[id number]]   hydrochlorothiazide (HYDRODIURIL) 25 MG tablet [[id number]]  Pharmacy  [id number] [place], [address] [place] [address]  Phone:  [phone]  Fax:  [phone]

## 2021-01-17 ENCOUNTER — Other Ambulatory Visit: Payer: Self-pay | Admitting: Family Medicine

## 2021-01-17 MED ORDER — HYDROXYZINE HCL 25 MG PO TABS: 25.0000 mg | ORAL_TABLET | Freq: Four times a day (QID) | ORAL | 0 refills | Status: DC

## 2021-01-30 ENCOUNTER — Other Ambulatory Visit: Payer: Self-pay | Admitting: Family Medicine

## 2021-01-30 ENCOUNTER — Encounter: Payer: Self-pay | Admitting: Oncology

## 2021-01-30 DIAGNOSIS — I10 Essential (primary) hypertension: Secondary | ICD-10-CM

## 2021-01-31 ENCOUNTER — Ambulatory Visit (INDEPENDENT_AMBULATORY_CARE_PROVIDER_SITE_OTHER): Payer: BC Managed Care – PPO | Admitting: Family Medicine

## 2021-01-31 ENCOUNTER — Other Ambulatory Visit: Payer: Self-pay

## 2021-01-31 VITALS — BP 131/79 | HR 67 | Temp 98.1°F | Resp 16 | Wt 259.2 lb

## 2021-01-31 DIAGNOSIS — F32A Depression, unspecified: Secondary | ICD-10-CM

## 2021-01-31 DIAGNOSIS — I10 Essential (primary) hypertension: Secondary | ICD-10-CM | POA: Diagnosis not present

## 2021-01-31 DIAGNOSIS — K5900 Constipation, unspecified: Secondary | ICD-10-CM | POA: Diagnosis not present

## 2021-01-31 DIAGNOSIS — Z6837 Body mass index (BMI) 37.0-37.9, adult: Secondary | ICD-10-CM

## 2021-01-31 DIAGNOSIS — F419 Anxiety disorder, unspecified: Secondary | ICD-10-CM | POA: Diagnosis not present

## 2021-01-31 DIAGNOSIS — Z1211 Encounter for screening for malignant neoplasm of colon: Secondary | ICD-10-CM

## 2021-01-31 MED ORDER — AMLODIPINE BESYLATE 10 MG PO TABS
10.0000 mg | ORAL_TABLET | Freq: Every day | ORAL | 0 refills | Status: DC
Start: 2021-01-31 — End: 2021-05-14

## 2021-01-31 MED ORDER — PAROXETINE HCL 20 MG PO TABS: 20.0000 mg | ORAL_TABLET | Freq: Every day | ORAL | 0 refills | Status: DC

## 2021-01-31 MED ORDER — HYDROCHLOROTHIAZIDE 25 MG PO TABS: 25.0000 mg | ORAL_TABLET | Freq: Every day | ORAL | 0 refills | Status: DC

## 2021-01-31 MED ORDER — LISINOPRIL 10 MG PO TABS: 10.0000 mg | ORAL_TABLET | Freq: Every day | ORAL | 0 refills | Status: DC

## 2021-01-31 NOTE — Progress Notes (Signed)
Patient is here for follow-up HTN,Hyperlipemia, depression. Patient said he is doing well with the medication.

## 2021-02-01 ENCOUNTER — Encounter: Payer: Self-pay | Admitting: Family Medicine

## 2021-02-01 NOTE — Progress Notes (Signed)
Established Patient Office Visit  Subjective:  Patient ID: Matthew Hatfield, male    DOB: 11-Aug-1965  Age: 55 y.o. MRN: [id number]  CC:  Chief Complaint  Patient presents with   Follow-up    HPI Matthew Hatfield presents for follow up of chronic med issues. Patient complaints of constipation. He has been trying to utilize OTC fiber but it has been minimally successful.   Past Medical History:  Diagnosis Date   Cancer of base of tongue (HCC)    GERD (gastroesophageal reflux disease)    Hyperlipidemia    Hypertension    Personal history of testicular cancer    Throat cancer ([place])     Past Surgical History:  Procedure Laterality Date   CHOLECYSTECTOMY     COLONOSCOPY     HERNIA REPAIR     KNEE SURGERY     RADICAL ORCHIECTOMY Right    TONSILLECTOMY      Family History  Problem Relation Age of Onset   Cancer Mother    Heart disease Mother    Lung cancer Father    Diabetes Maternal Uncle    Colon polyps Neg Hx    Colon cancer Neg Hx    Esophageal cancer Neg Hx    Stomach cancer Neg Hx    Rectal cancer Neg Hx     Social History   Socioeconomic History   Marital status: Widowed    Spouse name: Not on file   Number of children: Not on file   Years of education: Not on file   Highest education level: Not on file  Occupational History   Not on file  Tobacco Use   Smoking status: Never   Smokeless tobacco: Never  Vaping Use   Vaping Use: Never used  Substance and Sexual Activity   Alcohol use: No   Drug use: No   Sexual activity: Not on file  Other Topics Concern   Not on file  Social History Narrative   Not on file   Social Determinants of Health   Financial Resource Strain: Not on file  Food Insecurity: Not on file  Transportation Needs: Not on file  Physical Activity: Not on file  Stress: Not on file  Social Connections: Not on file  Intimate Partner Violence: Not on file    ROS Review of Systems  All other systems reviewed and are  negative.  Objective:   Today's Vitals: BP 131/79    Pulse 67    Temp 98.1 F (36.7 C) (Oral)    Resp 16    Wt 259 lb 3.2 oz (117.6 kg)    SpO2 97%    BMI 37.19 kg/m   Physical Exam Vitals and nursing note reviewed.  Constitutional:      General: He is not in acute distress.    Appearance: He is obese.  Cardiovascular:     Rate and Rhythm: Normal rate and regular rhythm.  Pulmonary:     Effort: Pulmonary effort is normal.     Breath sounds: Normal breath sounds.  Abdominal:     Palpations: Abdomen is soft.     Tenderness: There is no abdominal tenderness.  Neurological:     General: No focal deficit present.     Mental Status: He is alert and oriented to person, place, and time.    Assessment & Plan:   1. Essential hypertension Appears stable. Continue present management and monitor.  2. Anxiety and depression Paxil 20 mg daily prescribed. monitor  3. Constipation, unspecified  constipation type Discussed adequate fluids and addition of fiber and prune juice.   4. Screening for colon cancer  - Ambulatory referral to Gastroenterology  5. Class 2 severe obesity due to excess calories with serious comorbidity and body mass index (BMI) of 37.0 to 37.9 in adult [facility])    Outpatient Encounter Medications as of 01/31/2021  Medication Sig   amLODipine (NORVASC) 10 MG tablet Take 1 tablet (10 mg total) by mouth daily.   aspirin 81 MG chewable tablet Chew 81 mg by mouth.   atorvastatin (LIPITOR) 40 MG tablet Take 1 tablet (40 mg total) by mouth daily.   docusate sodium (COLACE) 100 MG capsule Take 100 mg by mouth 3 (three) times daily.   ferrous sulfate 325 (65 FE) MG EC tablet Take 325 mg by mouth 3 (three) times daily with meals.   hydrochlorothiazide (HYDRODIURIL) 25 MG tablet Take 1 tablet (25 mg total) by mouth daily.   hydrOXYzine (ATARAX) 25 MG tablet Take 1 tablet (25 mg total) by mouth every 6 (six) hours.   ibuprofen (ADVIL) 600 MG tablet Take 1 tablet (600 mg  total) by mouth every 6 (six) hours as needed.   lisinopril (ZESTRIL) 10 MG tablet Take 1 tablet (10 mg total) by mouth daily.   PARoxetine (PAXIL) 20 MG tablet Take 1 tablet (20 mg total) by mouth daily.   No facility-administered encounter medications on file as of 01/31/2021.    Follow-up: No follow-ups on file.   Becky Sax, MD

## 2021-02-03 ENCOUNTER — Telehealth: Payer: Self-pay

## 2021-02-03 NOTE — Telephone Encounter (Signed)
Spoke with patient and advised per Dr. Bobby Rumpf that Elevated TSH is likely due to hypothyroidism from previous tonsillar radiation that hit the thyroid gland.  Make appt with Pcp to discuss thyroid replacement.  Pt understood and was in agreement with recommendations.

## 2021-02-04 ENCOUNTER — Encounter: Payer: Self-pay | Admitting: Oncology

## 2021-02-24 DIAGNOSIS — R0789 Other chest pain: Secondary | ICD-10-CM | POA: Diagnosis not present

## 2021-02-24 DIAGNOSIS — R079 Chest pain, unspecified: Secondary | ICD-10-CM | POA: Diagnosis not present

## 2021-02-24 DIAGNOSIS — Z20822 Contact with and (suspected) exposure to covid-19: Secondary | ICD-10-CM | POA: Diagnosis not present

## 2021-03-01 ENCOUNTER — Encounter: Payer: Self-pay | Admitting: Family Medicine

## 2021-03-01 ENCOUNTER — Other Ambulatory Visit: Payer: Self-pay

## 2021-03-01 ENCOUNTER — Ambulatory Visit (INDEPENDENT_AMBULATORY_CARE_PROVIDER_SITE_OTHER): Payer: BC Managed Care – PPO | Admitting: Family Medicine

## 2021-03-01 VITALS — BP 117/74 | HR 68 | Temp 98.1°F | Resp 16 | Ht 70.0 in | Wt 260.4 lb

## 2021-03-01 DIAGNOSIS — E785 Hyperlipidemia, unspecified: Secondary | ICD-10-CM | POA: Diagnosis not present

## 2021-03-01 DIAGNOSIS — F419 Anxiety disorder, unspecified: Secondary | ICD-10-CM | POA: Diagnosis not present

## 2021-03-01 DIAGNOSIS — I10 Essential (primary) hypertension: Secondary | ICD-10-CM

## 2021-03-01 DIAGNOSIS — R0789 Other chest pain: Secondary | ICD-10-CM

## 2021-03-01 DIAGNOSIS — F32A Depression, unspecified: Secondary | ICD-10-CM

## 2021-03-01 MED ORDER — ATORVASTATIN CALCIUM 40 MG PO TABS: 40.0000 mg | ORAL_TABLET | Freq: Every day | ORAL | 3 refills | Status: DC

## 2021-03-01 NOTE — Progress Notes (Signed)
Patient follow-up hospital visit for chest pain. Patient said everything came back ok just had muscle/ chest spasm

## 2021-03-01 NOTE — Progress Notes (Signed)
New Patient Office Visit  Subjective:  Patient ID: Matthew Hatfield, male    DOB: 10-10-1965  Age: 56 y.o. MRN: [id number]  CC:  Chief Complaint  Patient presents with   Medication Refill   Hypertension    HPI Matthew Hatfield presents for follow up of emergency room visit for chest pains with right arm pain. Workup was unremarkable for cardiac or pulmonary. Treatment for Musculoskelatal  chest wall pain has resolved sx.   Past Medical History:  Diagnosis Date   Cancer of base of tongue (HCC)    GERD (gastroesophageal reflux disease)    Hyperlipidemia    Hypertension    Personal history of testicular cancer    Throat cancer ([place])     Past Surgical History:  Procedure Laterality Date   CHOLECYSTECTOMY     COLONOSCOPY     HERNIA REPAIR     KNEE SURGERY     RADICAL ORCHIECTOMY Right    TONSILLECTOMY      Family History  Problem Relation Age of Onset   Cancer Mother    Heart disease Mother    Lung cancer Father    Diabetes Maternal Uncle    Colon polyps Neg Hx    Colon cancer Neg Hx    Esophageal cancer Neg Hx    Stomach cancer Neg Hx    Rectal cancer Neg Hx     Social History   Socioeconomic History   Marital status: Widowed    Spouse name: Not on file   Number of children: Not on file   Years of education: Not on file   Highest education level: Not on file  Occupational History   Not on file  Tobacco Use   Smoking status: Never   Smokeless tobacco: Never  Vaping Use   Vaping Use: Never used  Substance and Sexual Activity   Alcohol use: No   Drug use: No   Sexual activity: Not on file  Other Topics Concern   Not on file  Social History Narrative   Not on file   Social Determinants of Health   Financial Resource Strain: Not on file  Food Insecurity: Not on file  Transportation Needs: Not on file  Physical Activity: Not on file  Stress: Not on file  Social Connections: Not on file  Intimate Partner Violence: Not on file    ROS Review of  Systems  Respiratory:  Negative for shortness of breath.   Cardiovascular:  Positive for chest pain. Negative for palpitations.  All other systems reviewed and are negative.  Objective:   Today's Vitals: BP 117/74    Pulse 68    Temp 98.1 F (36.7 C) (Oral)    Resp 16    Ht 5\' 10"  (1.778 m)    Wt 260 lb 6.4 oz (118.1 kg)    SpO2 96%    BMI 37.36 kg/m   Physical Exam Vitals and nursing note reviewed.  Constitutional:      General: He is not in acute distress. Cardiovascular:     Rate and Rhythm: Normal rate and regular rhythm.  Pulmonary:     Effort: Pulmonary effort is normal.     Breath sounds: Normal breath sounds.  Chest:     Chest wall: No deformity.  Abdominal:     Palpations: Abdomen is soft.     Tenderness: There is no abdominal tenderness.  Musculoskeletal:     Right lower leg: No edema.     Left lower leg: No edema.  Neurological:     General: No focal deficit present.     Mental Status: He is alert and oriented to person, place, and time.  Psychiatric:        Mood and Affect: Affect normal. Mood is anxious.        Behavior: Behavior normal.    Assessment & Plan:   1. Chest wall pain Improved. Continue present management.   2. Essential hypertension Appears stable. Continue present management.   3. Dyslipidemia Continue present management. Meds refilled.  - atorvastatin (LIPITOR) 40 MG tablet; Take 1 tablet (40 mg total) by mouth daily.  Dispense: 90 tablet; Refill: 3  4. Anxiety and depression Appears stable. Continue present management and monitor.     Outpatient Encounter Medications as of 03/01/2021  Medication Sig   amLODipine (NORVASC) 10 MG tablet Take 1 tablet (10 mg total) by mouth daily.   aspirin 81 MG chewable tablet Chew 81 mg by mouth.   atorvastatin (LIPITOR) 40 MG tablet Take 1 tablet (40 mg total) by mouth daily.   docusate sodium (COLACE) 100 MG capsule Take 100 mg by mouth 3 (three) times daily.   ferrous sulfate 325 (65 FE) MG EC  tablet Take 325 mg by mouth 3 (three) times daily with meals.   hydrochlorothiazide (HYDRODIURIL) 25 MG tablet Take 1 tablet (25 mg total) by mouth daily.   hydrOXYzine (ATARAX) 25 MG tablet Take 1 tablet (25 mg total) by mouth every 6 (six) hours.   ibuprofen (ADVIL) 600 MG tablet Take 1 tablet (600 mg total) by mouth every 6 (six) hours as needed.   lisinopril (ZESTRIL) 10 MG tablet Take 1 tablet (10 mg total) by mouth daily.   methocarbamol (ROBAXIN) 500 MG tablet Take 500 mg by mouth 3 (three) times daily.   PARoxetine (PAXIL) 20 MG tablet Take 1 tablet (20 mg total) by mouth daily.   No facility-administered encounter medications on file as of 03/01/2021.    Follow-up: No follow-ups on file.   Becky Sax, MD

## 2021-03-02 ENCOUNTER — Encounter: Payer: Self-pay | Admitting: Family Medicine

## 2021-04-25 ENCOUNTER — Other Ambulatory Visit: Payer: Self-pay

## 2021-04-25 ENCOUNTER — Ambulatory Visit (INDEPENDENT_AMBULATORY_CARE_PROVIDER_SITE_OTHER): Payer: BC Managed Care – PPO

## 2021-04-25 ENCOUNTER — Ambulatory Visit (INDEPENDENT_AMBULATORY_CARE_PROVIDER_SITE_OTHER): Payer: BC Managed Care – PPO | Admitting: Family Medicine

## 2021-04-25 ENCOUNTER — Encounter: Payer: Self-pay | Admitting: Family Medicine

## 2021-04-25 VITALS — BP 138/84 | HR 74 | Temp 98.0°F | Resp 18 | Wt 264.2 lb

## 2021-04-25 DIAGNOSIS — R1030 Lower abdominal pain, unspecified: Secondary | ICD-10-CM | POA: Diagnosis not present

## 2021-04-25 NOTE — Progress Notes (Signed)
Patient is here with c/o abdominal issues since eating breakfast the prior day. Patient denies any nausea or vomiting. Patient has continue to eat food . Patient said that he eats through the pain . Pain 8/10 ?

## 2021-04-25 NOTE — Progress Notes (Signed)
Established Patient Office Visit  Subjective:  Patient ID: Matthew Hatfield, male    DOB: [birth year]  Age: 56 y.o. MRN: [id number]  CC:  Chief Complaint  Patient presents with   Abdominal Pain    HPI Matthew Hatfield presents for complaint of abdominal pain since eating about 24-36 hours ago. He denies n/v/d. He denies exposure or contacts with persons with similar sx.   Past Medical History:  Diagnosis Date   Cancer of base of tongue (HCC)    GERD (gastroesophageal reflux disease)    Hyperlipidemia    Hypertension    Personal history of testicular cancer    Throat cancer (HCC)     Past Surgical History:  Procedure Laterality Date   CHOLECYSTECTOMY     COLONOSCOPY     HERNIA REPAIR     KNEE SURGERY     RADICAL ORCHIECTOMY Right    TONSILLECTOMY      Family History  Problem Relation Age of Onset   Cancer Mother    Heart disease Mother    Lung cancer Father    Diabetes Maternal Uncle    Colon polyps Neg Hx    Colon cancer Neg Hx    Esophageal cancer Neg Hx    Stomach cancer Neg Hx    Rectal cancer Neg Hx     Social History   Socioeconomic History   Marital status: Widowed    Spouse name: Not on file   Number of children: Not on file   Years of education: Not on file   Highest education level: Not on file  Occupational History   Not on file  Tobacco Use   Smoking status: Never   Smokeless tobacco: Never  Vaping Use   Vaping Use: Never used  Substance and Sexual Activity   Alcohol use: No   Drug use: No   Sexual activity: Not on file  Other Topics Concern   Not on file  Social History Narrative   Not on file   Social Determinants of Health   Financial Resource Strain: Not on file  Food Insecurity: Not on file  Transportation Needs: Not on file  Physical Activity: Not on file  Stress: Not on file  Social Connections: Not on file  Intimate Partner Violence: Not on file    ROS Review of Systems  Constitutional:  Negative for chills and  fever.  Gastrointestinal:  Positive for abdominal pain. Negative for anal bleeding, blood in stool, constipation, diarrhea, nausea, rectal pain and vomiting.  Genitourinary:  Negative for dysuria, flank pain and frequency.  All other systems reviewed and are negative.  Objective:   Today's Vitals: BP 138/84   Pulse 74   Temp 98 F (36.7 C) (Oral)   Resp 18   Wt 264 lb 3.2 oz (119.8 kg)   SpO2 95%   BMI 37.91 kg/m   Physical Exam Vitals and nursing note reviewed.  Constitutional:      General: He is not in acute distress. Cardiovascular:     Rate and Rhythm: Normal rate and regular rhythm.  Pulmonary:     Effort: Pulmonary effort is normal.     Breath sounds: Normal breath sounds.  Abdominal:     Palpations: Abdomen is soft.     Tenderness: There is abdominal tenderness in the left lower quadrant. There is no right CVA tenderness, left CVA tenderness, guarding or rebound.  Neurological:     General: No focal deficit present.     Mental Status: He is  alert and oriented to person, place, and time.    Assessment & Plan:   1. Lower abdominal pain ? Diverticulitis. Will get abdominal xray. Pain meds prn. Adequate fluids.  - DG Abd 1 View; Future  Outpatient Encounter Medications as of 04/25/2021  Medication Sig   amLODipine (NORVASC) 10 MG tablet Take 1 tablet (10 mg total) by mouth daily.   aspirin 81 MG chewable tablet Chew 81 mg by mouth.   atorvastatin (LIPITOR) 40 MG tablet Take 1 tablet (40 mg total) by mouth daily.   docusate sodium (COLACE) 100 MG capsule Take 100 mg by mouth 3 (three) times daily.   ferrous sulfate 325 (65 FE) MG EC tablet Take 325 mg by mouth 3 (three) times daily with meals.   hydrochlorothiazide (HYDRODIURIL) 25 MG tablet Take 1 tablet (25 mg total) by mouth daily.   hydrOXYzine (ATARAX) 25 MG tablet Take 1 tablet (25 mg total) by mouth every 6 (six) hours.   ibuprofen (ADVIL) 600 MG tablet Take 1 tablet (600 mg total) by mouth every 6 (six)  hours as needed.   lisinopril (ZESTRIL) 10 MG tablet Take 1 tablet (10 mg total) by mouth daily.   methocarbamol (ROBAXIN) 500 MG tablet Take 500 mg by mouth 3 (three) times daily.   PARoxetine (PAXIL) 20 MG tablet Take 1 tablet (20 mg total) by mouth daily.   No facility-administered encounter medications on file as of 04/25/2021.    Follow-up: No follow-ups on file.   Tommie Raymond, MD

## 2021-04-26 ENCOUNTER — Encounter: Payer: Self-pay | Admitting: Family Medicine

## 2021-04-27 ENCOUNTER — Ambulatory Visit: Payer: Self-pay

## 2021-04-27 ENCOUNTER — Encounter: Payer: Self-pay | Admitting: Family Medicine

## 2021-04-27 NOTE — Telephone Encounter (Signed)
Pt called in wanting explanation on results from Monday. I advised pt abd XR looked neagitive but Dr. Redmond Pulling should follow up as well. Pt was also asking about testing for lactose intolerance. I advised him I didn't know of a specific test she should respond to his Mychart message and to try lactose free milk. Pt verbalized understanding.  ? ?Summary: general question re things on My Chart, not specfic  ? Pt states that he has a question re some of his reporting on MyChart, would not be specific but needs a fu at some point today [phone]   ?  ? ?

## 2021-04-28 ENCOUNTER — Encounter: Payer: Self-pay | Admitting: Family Medicine

## 2021-05-14 ENCOUNTER — Other Ambulatory Visit: Payer: Self-pay | Admitting: Family Medicine

## 2021-05-14 DIAGNOSIS — I10 Essential (primary) hypertension: Secondary | ICD-10-CM

## 2021-05-16 MED ORDER — AMLODIPINE BESYLATE 10 MG PO TABS: 10.0000 mg | ORAL_TABLET | Freq: Every day | ORAL | 0 refills | Status: DC

## 2021-05-16 MED ORDER — HYDROCHLOROTHIAZIDE 25 MG PO TABS: 25.0000 mg | ORAL_TABLET | Freq: Every day | ORAL | 0 refills | Status: DC

## 2021-05-23 ENCOUNTER — Other Ambulatory Visit: Payer: Self-pay | Admitting: Family Medicine

## 2021-05-23 ENCOUNTER — Ambulatory Visit: Payer: Self-pay

## 2021-05-23 DIAGNOSIS — M545 Low back pain, unspecified: Secondary | ICD-10-CM

## 2021-05-26 ENCOUNTER — Telehealth: Payer: Self-pay

## 2021-05-26 NOTE — Telephone Encounter (Signed)
Spoke to pt stated that he did not need the appt any longer he thinks he ate molded bread yesterday, because today he stated that the pain is gone and he is feeling fine ?

## 2021-06-27 ENCOUNTER — Ambulatory Visit: Payer: BC Managed Care – PPO | Admitting: Family Medicine

## 2021-06-27 NOTE — Telephone Encounter (Signed)
err

## 2021-07-03 NOTE — Progress Notes (Signed)
[facility]  [address] [place],  [place]  [id number] [phone]  Clinic Day:  07/04/2021  Referring physician: Dorna Mai, MD  HISTORY OF PRESENT ILLNESS:  The patient is a 56 y.o. 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  He completed concurrent chemoradiation, which consisted of weekly carboplatin/paclitaxel, in May 2019.  All scans and physical exams since then have shown no signs of disease recurrence.  Of note, he was also found to have iron deficiency anemia, for which he received IV iron in the past.  Over the past few months, he has been taking at least 2 iron pills daily as iron deficiency anemia was seen again with his labs.  He comes in today routine follow up.  Since his last visit, the patient has been doing well.  He denies having increased fatigue or any overt forms of blood loss which concern him for persistent iron deficiency anemia.  Of note, he also has a history of seminoma, for which he underwent a orchiectomy in 2008, followed by BEP chemotherapy, which was completed in 2009.  Scans and exams since then never showed signs of disease recurrence.  He has been considered cured of this disease.    PHYSICAL EXAM:  Blood pressure 137/77, pulse 74, temperature 97.8 F (36.6 C), resp. rate 18, height '5\' 10"'$  (1.778 m), weight 253 lb 3.2 oz (114.9 kg), SpO2 98 %. Wt Readings from Last 3 Encounters:  07/04/21 253 lb 3.2 oz (114.9 kg)  04/25/21 264 lb 3.2 oz (119.8 kg)  03/01/21 260 lb 6.4 oz (118.1 kg)   Body mass index is 36.33 kg/m. Performance status (ECOG): 0 Physical Exam Constitutional:      Appearance: Normal appearance. He is not ill-appearing.  HENT:     Mouth/Throat:     Mouth: Mucous membranes are moist.     Pharynx: Oropharynx is clear. No oropharyngeal exudate or posterior oropharyngeal erythema.  Cardiovascular:     Rate and Rhythm: Normal rate and regular  rhythm.     Heart sounds: No murmur heard.   No friction rub. No gallop.  Pulmonary:     Effort: Pulmonary effort is normal. No respiratory distress.     Breath sounds: Normal breath sounds. No wheezing, rhonchi or rales.  Abdominal:     General: Bowel sounds are normal. There is no distension.     Palpations: Abdomen is soft. There is no mass.     Tenderness: There is no abdominal tenderness.  Musculoskeletal:        General: No swelling.     Right lower leg: No edema.     Left lower leg: No edema.  Lymphadenopathy:     Cervical: No cervical adenopathy.     Upper Body:     Right upper body: No supraclavicular or axillary adenopathy.     Left upper body: No supraclavicular or axillary adenopathy.     Lower Body: No right inguinal adenopathy. No left inguinal adenopathy.  Skin:    General: Skin is warm.     Coloration: Skin is not jaundiced.     Findings: No lesion or rash.  Neurological:     General: No focal deficit present.     Mental Status: He is alert and oriented to person, place, and time. Mental status is at baseline.  Psychiatric:        Mood and Affect: Mood normal.  Behavior: Behavior normal.        Thought Content: Thought content normal.   LABS:    ASSESSMENT & PLAN:  Assessment/Plan:  A 56 y.o. 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  However, his much more pressing issue currently is his persistent iron deficiency anemia.  When comparing his labs today to what they have been previously, both his hemoglobin and MCV are much lower than what they have been previously.  As the patient did have problems with IV iron in the past, which included developing hives, I will arrange for him to receive both Benadryl and Pepcid upfront before doses of IV iron are given over these next few days.  I will also arrange for him to be seen by gastroenterology as quickly  as possible so he can undergo the necessary GI work-up to evaluate the etiology behind his iron deficiency anemia.  Otherwise, I will see this patient back in 3 months to reassess his iron and hemoglobin levels to see how well he responded to his upcoming IV iron.  I have no problem with him continuing to take his oral iron on a daily basis as well.  The patient understands all the plans discussed today and is in agreement with them.    Chino Sardo Macarthur Critchley, MD

## 2021-07-04 ENCOUNTER — Telehealth: Payer: Self-pay | Admitting: Oncology

## 2021-07-04 ENCOUNTER — Other Ambulatory Visit: Payer: Self-pay | Admitting: Oncology

## 2021-07-04 ENCOUNTER — Inpatient Hospital Stay: Payer: BLUE CROSS/BLUE SHIELD

## 2021-07-04 ENCOUNTER — Inpatient Hospital Stay: Payer: BLUE CROSS/BLUE SHIELD | Attending: Oncology | Admitting: Oncology

## 2021-07-04 ENCOUNTER — Encounter: Payer: Self-pay | Admitting: Oncology

## 2021-07-04 ENCOUNTER — Other Ambulatory Visit: Payer: Self-pay

## 2021-07-04 ENCOUNTER — Encounter: Payer: Self-pay | Admitting: Family Medicine

## 2021-07-04 VITALS — BP 137/77 | HR 74 | Temp 97.8°F | Resp 18 | Ht 70.0 in | Wt 253.2 lb

## 2021-07-04 DIAGNOSIS — C01 Malignant neoplasm of base of tongue: Secondary | ICD-10-CM | POA: Diagnosis not present

## 2021-07-04 DIAGNOSIS — D509 Iron deficiency anemia, unspecified: Secondary | ICD-10-CM | POA: Insufficient documentation

## 2021-07-04 DIAGNOSIS — D508 Other iron deficiency anemias: Secondary | ICD-10-CM | POA: Diagnosis not present

## 2021-07-04 DIAGNOSIS — Z85810 Personal history of malignant neoplasm of tongue: Secondary | ICD-10-CM | POA: Insufficient documentation

## 2021-07-04 LAB — HEPATIC FUNCTION PANEL
ALT: 15 U/L (ref 10–40)
AST: 19 (ref 14–40)
Alkaline Phosphatase: 89 (ref 25–125)
Bilirubin, Total: 0.5

## 2021-07-04 LAB — CBC AND DIFFERENTIAL
HCT: 23 — AB (ref 41–53)
Hemoglobin: 6.6 — AB (ref 13.5–17.5)
Neutrophils Absolute: 4.96
Platelets: 321 10*3/uL (ref 150–400)
WBC: 6.8

## 2021-07-04 LAB — COMPREHENSIVE METABOLIC PANEL
Albumin: 4.1 (ref 3.5–5.0)
Calcium: 8.4 — AB (ref 8.7–10.7)

## 2021-07-04 LAB — BASIC METABOLIC PANEL
BUN: 12 (ref 4–21)
CO2: 31 — AB (ref 13–22)
Chloride: 96 — AB (ref 99–108)
Creatinine: 0.7 (ref 0.6–1.3)
Glucose: 190
Potassium: 2.9 mEq/L — AB (ref 3.5–5.1)
Sodium: 138 (ref 137–147)

## 2021-07-04 LAB — TSH: TSH: 6.177 u[IU]/mL — ABNORMAL HIGH (ref 0.350–4.500)

## 2021-07-04 LAB — CBC: RBC: 4.2 (ref 3.87–5.11)

## 2021-07-04 NOTE — Telephone Encounter (Signed)
Per 07/04/21 los next appt scheduled and confirmed with patient 

## 2021-07-04 NOTE — Addendum Note (Signed)
Addended by: Juanetta Beets on: 07/04/2021 10:34 AM   Modules accepted: Orders

## 2021-07-06 ENCOUNTER — Encounter: Payer: Self-pay | Admitting: Oncology

## 2021-07-12 ENCOUNTER — Other Ambulatory Visit: Payer: Self-pay

## 2021-07-12 DIAGNOSIS — Z85810 Personal history of malignant neoplasm of tongue: Secondary | ICD-10-CM | POA: Diagnosis not present

## 2021-07-12 DIAGNOSIS — D509 Iron deficiency anemia, unspecified: Secondary | ICD-10-CM | POA: Diagnosis not present

## 2021-07-12 LAB — FERRITIN: Ferritin: 4 ng/mL — ABNORMAL LOW (ref 24–336)

## 2021-07-12 LAB — IRON AND TIBC
Iron: 16 ug/dL — ABNORMAL LOW (ref 45–182)
Saturation Ratios: 3 % — ABNORMAL LOW (ref 17.9–39.5)
TIBC: 486 ug/dL — ABNORMAL HIGH (ref 250–450)
UIBC: 470 ug/dL

## 2021-07-18 ENCOUNTER — Encounter: Payer: Self-pay | Admitting: Oncology

## 2021-07-18 MED FILL — Iron Sucrose Inj 20 MG/ML (Fe Equiv): INTRAVENOUS | Qty: 10 | Status: AC

## 2021-07-19 ENCOUNTER — Inpatient Hospital Stay: Payer: BLUE CROSS/BLUE SHIELD | Attending: Oncology

## 2021-07-19 VITALS — BP 119/68 | HR 76 | Temp 98.2°F | Resp 62 | Ht 70.0 in | Wt 254.1 lb

## 2021-07-19 DIAGNOSIS — Z85810 Personal history of malignant neoplasm of tongue: Secondary | ICD-10-CM | POA: Diagnosis not present

## 2021-07-19 DIAGNOSIS — D509 Iron deficiency anemia, unspecified: Secondary | ICD-10-CM | POA: Diagnosis not present

## 2021-07-19 MED ORDER — ACETAMINOPHEN 325 MG PO TABS
650.0000 mg | ORAL_TABLET | Freq: Once | ORAL | Status: AC
  Administered 2021-07-19: 650 mg via ORAL
  Filled 2021-07-19: qty 2

## 2021-07-19 MED ORDER — SODIUM CHLORIDE 0.9 % IV SOLN: Freq: Once | INTRAVENOUS | Status: AC

## 2021-07-19 MED ORDER — LORATADINE 10 MG PO TABS
10.0000 mg | ORAL_TABLET | Freq: Once | ORAL | Status: DC
  Filled 2021-07-19: qty 1

## 2021-07-19 MED ORDER — SODIUM CHLORIDE 0.9 % IV SOLN
200.0000 mg | Freq: Once | INTRAVENOUS | Status: AC
  Administered 2021-07-19: 200 mg via INTRAVENOUS
  Filled 2021-07-19: qty 200

## 2021-07-19 MED ORDER — FAMOTIDINE IN NACL 20-0.9 MG/50ML-% IV SOLN
20.0000 mg | Freq: Once | INTRAVENOUS | Status: AC
  Administered 2021-07-19: 20 mg via INTRAVENOUS
  Filled 2021-07-19: qty 50

## 2021-07-19 NOTE — Progress Notes (Signed)
Venofer started at 200 ml/hr. Patient has history of reaction to iron in past.

## 2021-07-19 NOTE — Patient Instructions (Signed)

## 2021-07-19 NOTE — Progress Notes (Signed)
Patient reports an allergy to "antihistamines" -[facility] Pharm D aware and Claritin held

## 2021-07-20 ENCOUNTER — Encounter: Payer: Self-pay | Admitting: Oncology

## 2021-07-20 MED FILL — Iron Sucrose Inj 20 MG/ML (Fe Equiv): INTRAVENOUS | Qty: 10 | Status: AC

## 2021-07-21 ENCOUNTER — Inpatient Hospital Stay: Payer: BLUE CROSS/BLUE SHIELD

## 2021-07-21 ENCOUNTER — Other Ambulatory Visit: Payer: Self-pay | Admitting: Pharmacist

## 2021-07-21 ENCOUNTER — Encounter: Payer: Self-pay | Admitting: Pharmacist

## 2021-07-21 DIAGNOSIS — D509 Iron deficiency anemia, unspecified: Secondary | ICD-10-CM

## 2021-07-21 MED ORDER — ACETAMINOPHEN 325 MG PO TABS: 650.0000 mg | ORAL_TABLET | Freq: Once | ORAL | Status: DC

## 2021-07-21 MED ORDER — SODIUM CHLORIDE 0.9 % IV SOLN
200.0000 mg | Freq: Once | INTRAVENOUS | Status: DC
  Filled 2021-07-21: qty 10

## 2021-07-21 MED ORDER — SODIUM CHLORIDE 0.9 % IV SOLN: Freq: Once | INTRAVENOUS | Status: DC

## 2021-07-21 MED ORDER — FAMOTIDINE IN NACL 20-0.9 MG/50ML-% IV SOLN: 20.0000 mg | Freq: Once | INTRAVENOUS | Status: DC

## 2021-07-21 NOTE — Progress Notes (Signed)
PT REPORTS HAVING HIVES AROUND FACE NECK AND ARMS AFTER HIS IRON INFUSION TWO DAYS AGO.  THESE SYMPTOMS BEGAN AROUND 7 PM AFTER HE HAD GONE HOME. PT TOOK HIS OWN MEDICATION FOR "HAY FEVER" WHICH CONTAINS BENADRYL.  THESE AREAS ARE STILL RED AND ITCHY AT TIMES.  PT DECLINES TO REPEAT THE VENOFER AND PHARMACY/[facility] WILL INVESTIGATE ALTERNATIVE TREATMENT.

## 2021-07-22 ENCOUNTER — Encounter: Payer: Self-pay | Admitting: Oncology

## 2021-07-22 ENCOUNTER — Inpatient Hospital Stay: Payer: BLUE CROSS/BLUE SHIELD

## 2021-07-25 ENCOUNTER — Ambulatory Visit: Payer: Self-pay

## 2021-07-26 ENCOUNTER — Encounter: Payer: Self-pay | Admitting: Pharmacist

## 2021-07-27 ENCOUNTER — Ambulatory Visit: Payer: BLUE CROSS/BLUE SHIELD

## 2021-07-28 ENCOUNTER — Ambulatory Visit: Payer: BLUE CROSS/BLUE SHIELD

## 2021-08-04 ENCOUNTER — Ambulatory Visit: Payer: BLUE CROSS/BLUE SHIELD

## 2021-08-11 ENCOUNTER — Other Ambulatory Visit: Payer: Self-pay | Admitting: Family Medicine

## 2021-08-11 DIAGNOSIS — I10 Essential (primary) hypertension: Secondary | ICD-10-CM

## 2021-09-15 ENCOUNTER — Other Ambulatory Visit: Payer: Self-pay | Admitting: Family Medicine

## 2021-09-15 DIAGNOSIS — I10 Essential (primary) hypertension: Secondary | ICD-10-CM

## 2021-09-19 MED ORDER — LISINOPRIL 10 MG PO TABS: 10.0000 mg | ORAL_TABLET | Freq: Every day | ORAL | 0 refills | Status: DC

## 2021-09-19 MED ORDER — AMLODIPINE BESYLATE 10 MG PO TABS: 10.0000 mg | ORAL_TABLET | Freq: Every day | ORAL | 0 refills | Status: DC

## 2021-10-04 ENCOUNTER — Inpatient Hospital Stay: Payer: BLUE CROSS/BLUE SHIELD

## 2021-10-04 ENCOUNTER — Ambulatory Visit: Payer: Self-pay | Admitting: Oncology

## 2021-10-25 ENCOUNTER — Ambulatory Visit
Admission: EM | Admit: 2021-10-25 | Discharge: 2021-10-25 | Disposition: A | Discharge status: Home / Self Care | Payer: BLUE CROSS/BLUE SHIELD | Attending: Physician Assistant | Admitting: Physician Assistant

## 2021-10-25 ENCOUNTER — Ambulatory Visit: Payer: Self-pay

## 2021-10-25 DIAGNOSIS — S80861A Insect bite (nonvenomous), right lower leg, initial encounter: Secondary | ICD-10-CM

## 2021-10-25 DIAGNOSIS — W57XXXA Bitten or stung by nonvenomous insect and other nonvenomous arthropods, initial encounter: Secondary | ICD-10-CM

## 2021-10-25 MED ORDER — DOXYCYCLINE HYCLATE 100 MG PO CAPS: 100.0000 mg | ORAL_CAPSULE | Freq: Two times a day (BID) | ORAL | 0 refills | Status: DC

## 2021-10-25 NOTE — ED Provider Notes (Signed)
[facility]    CSN: [id number] Arrival date & time: 10/25/21  6283      History   Chief Complaint Chief Complaint  Patient presents with   Rash    HPI Matthew Hatfield is a 56 y.o. male.   Patient here today for evaluation of a small lesion to his right anterior leg that he believes may be a spider bite.  He denies actually seeing a spider.  He notes that he first noticed the lesion this morning.  He denies any significant pain.  He has not had any fever.  The history is provided by the patient.  Rash Associated symptoms: no fever and no shortness of breath     Past Medical History:  Diagnosis Date   Cancer of base of tongue (HCC)    GERD (gastroesophageal reflux disease)    Hyperlipidemia    Hypertension    Personal history of testicular cancer    Throat cancer [facility])     Patient Active Problem List   Diagnosis Date Noted   Bilateral lower extremity edema 10/27/2020   Spondylolisthesis, lumbar region 07/08/2020   Malignant neoplasm of base of tongue ([place]) 06/24/2020   Iron deficiency anemia 02/04/2020   Seborrheic keratosis 03/15/2016   Testicular cancer ([place]) 01/26/2015   Essential hypertension 08/19/2013   Seminoma ([place]) 08/06/2013    Past Surgical History:  Procedure Laterality Date   CHOLECYSTECTOMY     COLONOSCOPY     HERNIA REPAIR     KNEE SURGERY     RADICAL ORCHIECTOMY Right    TONSILLECTOMY         Home Medications    Prior to Admission medications   Medication Sig Start Date End Date Taking? Authorizing Provider  doxycycline (VIBRAMYCIN) 100 MG capsule Take 1 capsule (100 mg total) by mouth 2 (two) times daily. 10/25/21  Yes Francene Finders, PA-C  amLODipine (NORVASC) 10 MG tablet Take 1 tablet (10 mg total) by mouth daily. 09/19/21   Dorna Mai, MD  aspirin 81 MG chewable tablet Chew 81 mg by mouth.    [provider]  atorvastatin (LIPITOR) 40 MG tablet Take 1 tablet (40 mg total) by mouth daily. 03/01/21 05/30/21   Dorna Mai, MD  docusate sodium (COLACE) 100 MG capsule Take 100 mg by mouth 3 (three) times daily. 01/03/21   Lewis, Dequincy A, MD  ferrous sulfate 325 (65 FE) MG EC tablet Take 325 mg by mouth 3 (three) times daily with meals. 01/03/21   Lewis, Dequincy A, MD  hydrochlorothiazide (HYDRODIURIL) 25 MG tablet TAKE 1 TABLET (25 MG TOTAL) BY MOUTH DAILY. 08/11/21   Dorna Mai, MD  hydrOXYzine (ATARAX) 25 MG tablet Take 1 tablet (25 mg total) by mouth every 6 (six) hours. 01/17/21   Dorna Mai, MD  ibuprofen (ADVIL) 600 MG tablet Take 1 tablet (600 mg total) by mouth every 6 (six) hours as needed. 06/20/20   Melynda Ripple, MD  lisinopril (ZESTRIL) 10 MG tablet Take 1 tablet (10 mg total) by mouth daily. 09/19/21   Dorna Mai, MD  meloxicam [facility]) 15 MG tablet take 1 tablet by oral route  every day with food 07/07/21   [provider]  methocarbamol (ROBAXIN) 500 MG tablet Take 500 mg by mouth 3 (three) times daily. 02/24/21   [provider]  PARoxetine (PAXIL) 20 MG tablet Take 1 tablet (20 mg total) by mouth daily. 01/31/21   Dorna Mai, MD    Family History Family History  Problem Relation  Age of Onset   Cancer Mother    Heart disease Mother    Lung cancer Father    Diabetes Maternal Uncle    Colon polyps Neg Hx    Colon cancer Neg Hx    Esophageal cancer Neg Hx    Stomach cancer Neg Hx    Rectal cancer Neg Hx     Social History Social History   Tobacco Use   Smoking status: Never   Smokeless tobacco: Never  Vaping Use   Vaping Use: Never used  Substance Use Topics   Alcohol use: No   Drug use: No     Allergies   Other; Lidocaine; Shellfish-derived products; Venofer [iron sucrose]; Antihistamines, diphenhydramine-type; and Feraheme [ferumoxytol]   Review of Systems Review of Systems  Constitutional:  Negative for chills and fever.  Eyes:  Negative for discharge and redness.  Respiratory:  Negative for shortness of breath.   Skin:   Positive for wound. Negative for color change.  Neurological:  Negative for numbness.     Physical Exam Triage Vital Signs ED Triage Vitals  Enc Vitals Group     BP 10/25/21 0924 119/78     Pulse Rate 10/25/21 0924 86     Resp 10/25/21 0924 18     Temp 10/25/21 0924 (!) 97.4 F (36.3 C)     Temp Source 10/25/21 0924 Oral     SpO2 10/25/21 0924 96 %     Weight --      Height --      Head Circumference --      Peak Flow --      Pain Score 10/25/21 0925 4     Pain Loc --      Pain Edu? --      Excl. in [place]? --    No data found.  Updated Vital Signs BP 119/78 (BP Location: Left Arm)   Pulse 86   Temp (!) 97.4 F (36.3 C) (Oral)   Resp 18   SpO2 96%      Physical Exam Vitals and nursing note reviewed.  Constitutional:      General: He is not in acute distress.    Appearance: Normal appearance. He is not ill-appearing.  HENT:     Head: Normocephalic and atraumatic.  Eyes:     Conjunctiva/sclera: Conjunctivae normal.  Cardiovascular:     Rate and Rhythm: Normal rate.  Pulmonary:     Effort: Pulmonary effort is normal. No respiratory distress.  Skin:    General: Skin is warm and dry.     Findings: Lesion (approx 1 cm superficial wound to right anterior lower leg without surrounding erythema, drainage, or bleeding) present. No erythema.  Neurological:     Mental Status: He is alert.  Psychiatric:        Mood and Affect: Mood normal.        Behavior: Behavior normal.        Thought Content: Thought content normal.      UC Treatments / Results  Labs (all labs ordered are listed, but only abnormal results are displayed) Labs Reviewed - No data to display  EKG   Radiology No results found.  Procedures Procedures (including critical care time)  Medications Ordered in UC Medications - No data to display  Initial Impression / Assessment and Plan / UC Course  I have reviewed the triage vital signs and the nursing notes.  Pertinent labs & imaging results  that were available during my care of the  patient were reviewed by me and considered in my medical decision making (see chart for details).    Will treat with doxycycline given unknown cause of lesion. Encouraged follow up with any further concerns.   Final Clinical Impressions(s) / UC Diagnoses   Final diagnoses:  Insect bite of right lower leg, initial encounter   Discharge Instructions   None    ED Prescriptions     Medication Sig Dispense Auth. Provider   doxycycline (VIBRAMYCIN) 100 MG capsule Take 1 capsule (100 mg total) by mouth 2 (two) times daily. 20 capsule Francene Finders, PA-C      PDMP not reviewed this encounter.   Francene Finders, PA-C 10/25/21 1000

## 2021-10-25 NOTE — Telephone Encounter (Signed)
Pt states a "spot" just came up on his leg, size of an eraser.  He said was not there yesterday.  No appts at [facility]. Please advise.   Chief Complaint: Skin lesion right lower leg. Eraser size, tender, brown in color. Symptoms: Above Frequency: Yesterday Pertinent Negatives: Patient denies fever Disposition: '[]'$ ED /'[x]'$ Urgent Care (no appt availability in office) / '[]'$ Appointment(In office/virtual)/ '[]'$  [facility]/ '[]'$ Home Care/ '[]'$ Refused Recommended Disposition /'[]'$ [facility]/ '[]'$  Follow-up with PCP Additional Notes: No availability in practice.  Reason for Disposition  Caller can't describe it clearly  Answer Assessment - Initial Assessment Questions 1. APPEARANCE of LESION: "What does it look like?"      Eraser size 2. SIZE: "How big is it?" (e.g., inches, cm; or compare to size of pinhead, tip of pen, eraser, coin, pea, grape, ping pong ball)      Eraser 3. COLOR: "What color is it?" "Is there more than one color?"     Brown 4. SHAPE: "What shape is it?" (e.g., round, irregular)     Circle 5. RAISED: "Does it stick up above the skin or is it flat?" (e.g., raised or elevated)     Yes 6. TENDER: "Does it hurt when you touch it?"  (Scale 1-10; or mild, moderate, severe)     Yes 7. LOCATION: "Where is it located?"      Right lower leg 8. ONSET: "When did it first appear?"      Yesterday 9. NUMBER: "Is there just one?" or "Are there others?"     1 10. CAUSE: "What do you think it is?"       Unsure 11. OTHER SYMPTOMS: "Do you have any other symptoms?" (e.g., fever)       No 12. PREGNANCY: "Is there any chance you are pregnant?" "When was your last menstrual period?"       N/a  Protocols used: Skin Lesion - Moles or Growths-A-AH

## 2021-10-25 NOTE — ED Triage Notes (Signed)
Pt presents with small lesion on right leg that he believes may be a spider bite.

## 2021-10-25 NOTE — Telephone Encounter (Signed)
FYI

## 2021-11-04 ENCOUNTER — Other Ambulatory Visit: Payer: Self-pay | Admitting: Family Medicine

## 2021-11-04 DIAGNOSIS — I10 Essential (primary) hypertension: Secondary | ICD-10-CM

## 2021-11-17 ENCOUNTER — Other Ambulatory Visit: Payer: Self-pay | Admitting: Family Medicine

## 2021-11-17 DIAGNOSIS — I10 Essential (primary) hypertension: Secondary | ICD-10-CM

## 2021-11-17 DIAGNOSIS — E785 Hyperlipidemia, unspecified: Secondary | ICD-10-CM

## 2021-11-18 MED ORDER — HYDROCHLOROTHIAZIDE 25 MG PO TABS: 25.0000 mg | ORAL_TABLET | Freq: Every day | ORAL | 0 refills | Status: DC

## 2021-11-18 MED ORDER — LISINOPRIL 10 MG PO TABS: 10.0000 mg | ORAL_TABLET | Freq: Every day | ORAL | 0 refills | Status: DC

## 2021-11-18 MED ORDER — AMLODIPINE BESYLATE 10 MG PO TABS: 10.0000 mg | ORAL_TABLET | Freq: Every day | ORAL | 0 refills | Status: DC

## 2021-11-18 MED ORDER — ATORVASTATIN CALCIUM 40 MG PO TABS: 40.0000 mg | ORAL_TABLET | Freq: Every day | ORAL | 3 refills | Status: DC

## 2021-11-18 MED ORDER — HYDROXYZINE HCL 25 MG PO TABS: 25.0000 mg | ORAL_TABLET | Freq: Four times a day (QID) | ORAL | 0 refills | Status: DC

## 2021-11-30 ENCOUNTER — Ambulatory Visit (INDEPENDENT_AMBULATORY_CARE_PROVIDER_SITE_OTHER): Payer: BC Managed Care – PPO | Admitting: Family Medicine

## 2021-11-30 VITALS — BP 138/76 | HR 82 | Temp 97.7°F | Resp 16 | Wt 250.0 lb

## 2021-11-30 DIAGNOSIS — F419 Anxiety disorder, unspecified: Secondary | ICD-10-CM | POA: Diagnosis not present

## 2021-11-30 DIAGNOSIS — I10 Essential (primary) hypertension: Secondary | ICD-10-CM

## 2021-11-30 DIAGNOSIS — F32A Depression, unspecified: Secondary | ICD-10-CM | POA: Diagnosis not present

## 2021-11-30 MED ORDER — HYDROCHLOROTHIAZIDE 25 MG PO TABS: 25.0000 mg | ORAL_TABLET | Freq: Every day | ORAL | 1 refills | Status: DC

## 2021-11-30 MED ORDER — AMLODIPINE BESYLATE 10 MG PO TABS: 10.0000 mg | ORAL_TABLET | Freq: Every day | ORAL | 1 refills | Status: DC

## 2021-11-30 MED ORDER — PAROXETINE HCL 30 MG PO TABS: 30.0000 mg | ORAL_TABLET | Freq: Every day | ORAL | 1 refills | Status: DC

## 2021-11-30 MED ORDER — HYDROXYZINE HCL 25 MG PO TABS: 25.0000 mg | ORAL_TABLET | Freq: Four times a day (QID) | ORAL | 3 refills | Status: DC

## 2021-11-30 MED ORDER — LISINOPRIL 10 MG PO TABS: 10.0000 mg | ORAL_TABLET | Freq: Every day | ORAL | 1 refills | Status: DC

## 2021-12-01 ENCOUNTER — Encounter: Payer: Self-pay | Admitting: Family Medicine

## 2021-12-01 NOTE — Progress Notes (Signed)
Established Patient Office Visit  Subjective    Patient ID: Matthew Hatfield, male    DOB: 03-Sep-1965  Age: 56 y.o. MRN: [id number]  CC: No chief complaint on file.   HPI Matthew Hatfield presents for follow up of chronic med issues. Patient reports that his anxiety has increased some.    Outpatient Encounter Medications as of 11/30/2021  Medication Sig   PARoxetine (PAXIL) 30 MG tablet Take 1 tablet (30 mg total) by mouth daily.   amLODipine (NORVASC) 10 MG tablet Take 1 tablet (10 mg total) by mouth daily.   aspirin 81 MG chewable tablet Chew 81 mg by mouth.   atorvastatin (LIPITOR) 40 MG tablet Take 1 tablet (40 mg total) by mouth daily.   docusate sodium (COLACE) 100 MG capsule Take 100 mg by mouth 3 (three) times daily.   doxycycline (VIBRAMYCIN) 100 MG capsule Take 1 capsule (100 mg total) by mouth 2 (two) times daily.   ferrous sulfate 325 (65 FE) MG EC tablet Take 325 mg by mouth 3 (three) times daily with meals.   hydrochlorothiazide (HYDRODIURIL) 25 MG tablet Take 1 tablet (25 mg total) by mouth daily.   hydrOXYzine (ATARAX) 25 MG tablet Take 1 tablet (25 mg total) by mouth every 6 (six) hours.   lisinopril (ZESTRIL) 10 MG tablet Take 1 tablet (10 mg total) by mouth daily.   meloxicam (MOBIC) 15 MG tablet take 1 tablet by oral route  every day with food   PARoxetine (PAXIL) 20 MG tablet Take 1 tablet (20 mg total) by mouth daily.   [DISCONTINUED] amLODipine (NORVASC) 10 MG tablet Take 1 tablet (10 mg total) by mouth daily.   [DISCONTINUED] hydrochlorothiazide (HYDRODIURIL) 25 MG tablet Take 1 tablet (25 mg total) by mouth daily.   [DISCONTINUED] hydrOXYzine (ATARAX) 25 MG tablet Take 1 tablet (25 mg total) by mouth every 6 (six) hours.   [DISCONTINUED] ibuprofen (ADVIL) 600 MG tablet Take 1 tablet (600 mg total) by mouth every 6 (six) hours as needed.   [DISCONTINUED] lisinopril (ZESTRIL) 10 MG tablet Take 1 tablet (10 mg total) by mouth daily.   [DISCONTINUED] methocarbamol  (ROBAXIN) 500 MG tablet Take 500 mg by mouth 3 (three) times daily.   No facility-administered encounter medications on file as of 11/30/2021.    Past Medical History:  Diagnosis Date   Cancer of base of tongue (HCC)    GERD (gastroesophageal reflux disease)    Hyperlipidemia    Hypertension    Personal history of testicular cancer    Throat cancer ([place])     Past Surgical History:  Procedure Laterality Date   CHOLECYSTECTOMY     COLONOSCOPY     HERNIA REPAIR     KNEE SURGERY     RADICAL ORCHIECTOMY Right    TONSILLECTOMY      Family History  Problem Relation Age of Onset   Cancer Mother    Heart disease Mother    Lung cancer Father    Diabetes Maternal Uncle    Colon polyps Neg Hx    Colon cancer Neg Hx    Esophageal cancer Neg Hx    Stomach cancer Neg Hx    Rectal cancer Neg Hx     Social History   Socioeconomic History   Marital status: Widowed    Spouse name: Not on file   Number of children: Not on file   Years of education: Not on file   Highest education level: Not on file  Occupational History  Not on file  Tobacco Use   Smoking status: Never   Smokeless tobacco: Never  Vaping Use   Vaping Use: Never used  Substance and Sexual Activity   Alcohol use: No   Drug use: No   Sexual activity: Not on file  Other Topics Concern   Not on file  Social History Narrative   Not on file   Social Determinants of Health   Financial Resource Strain: Not on file  Food Insecurity: Not on file  Transportation Needs: Not on file  Physical Activity: Not on file  Stress: Not on file  Social Connections: Not on file  Intimate Partner Violence: Not on file    Review of Systems  Psychiatric/Behavioral:  Negative for suicidal ideas. The patient is nervous/anxious.   All other systems reviewed and are negative.       Objective    BP 138/76   Pulse 82   Temp 97.7 F (36.5 C) (Oral)   Resp 16   Wt 250 lb (113.4 kg)   SpO2 98%   BMI 35.87 kg/m    Physical Exam Vitals and nursing note reviewed.  Constitutional:      General: He is not in acute distress. Cardiovascular:     Rate and Rhythm: Normal rate and regular rhythm.  Pulmonary:     Effort: Pulmonary effort is normal.     Breath sounds: Normal breath sounds.  Abdominal:     Palpations: Abdomen is soft.     Tenderness: There is no abdominal tenderness.  Neurological:     General: No focal deficit present.     Mental Status: He is alert and oriented to person, place, and time.  Psychiatric:        Mood and Affect: Affect normal. Mood is anxious.         Assessment & Plan:   1. Essential hypertension Appears stable with present management. Continue and monitor - lisinopril (ZESTRIL) 10 MG tablet; Take 1 tablet (10 mg total) by mouth daily.  Dispense: 90 tablet; Refill: 1 - hydrochlorothiazide (HYDRODIURIL) 25 MG tablet; Take 1 tablet (25 mg total) by mouth daily.  Dispense: 90 tablet; Refill: 1 - amLODipine (NORVASC) 10 MG tablet; Take 1 tablet (10 mg total) by mouth daily.  Dispense: 90 tablet; Refill: 1  2. Anxiety and depression Will increase paxil from 20 mg to 30 mg daily and monitor. Hydroxyzine prescribed for prn use.     Return in about 6 months (around 06/01/2022) for follow up.   Becky Sax, MD

## 2021-12-12 ENCOUNTER — Encounter: Payer: Self-pay | Admitting: Family Medicine

## 2022-01-12 DIAGNOSIS — J029 Acute pharyngitis, unspecified: Secondary | ICD-10-CM | POA: Diagnosis not present

## 2022-01-13 DIAGNOSIS — M47816 Spondylosis without myelopathy or radiculopathy, lumbar region: Secondary | ICD-10-CM

## 2022-01-13 HISTORY — DX: Spondylosis without myelopathy or radiculopathy, lumbar region: M47.816

## 2022-01-18 DIAGNOSIS — D649 Anemia, unspecified: Secondary | ICD-10-CM | POA: Diagnosis not present

## 2022-01-18 DIAGNOSIS — E7800 Pure hypercholesterolemia, unspecified: Secondary | ICD-10-CM | POA: Diagnosis not present

## 2022-01-18 DIAGNOSIS — E876 Hypokalemia: Secondary | ICD-10-CM | POA: Diagnosis not present

## 2022-01-19 ENCOUNTER — Other Ambulatory Visit: Payer: Self-pay | Admitting: Oncology

## 2022-01-19 DIAGNOSIS — D509 Iron deficiency anemia, unspecified: Secondary | ICD-10-CM

## 2022-01-19 NOTE — Progress Notes (Signed)
[facility]  [address] [place],  [place]  [address] [phone]  Clinic Day:  01/20/2022  Referring physician: Dorna Mai, MD  HISTORY OF PRESENT ILLNESS:  The patient is a 56 y.o. 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  He completed concurrent chemoradiation, which consisted of weekly carboplatin/paclitaxel, in May 2019.  All scans and physical exams since then have shown no signs of disease recurrence.  Of note, he also has had severe iron deficiency anemia.  Unfortunately, the patient has had allergic reactions to both Feraheme and Venofer to where he has been very apprehensive about receiving additional formulations of IV iron.  The patient brings to my attention that labs done this week showed him with a low hemoglobin of only 6.4.  This led to him receiving 1 unit of blood yesterday.  He denies having any overt forms of blood loss.  He claims to have occasional black stools, but claims this happens when he takes oral iron.  Although he does not feel particularly bad, his low hemoglobin of 6.4 earlier this week does have him wanting to  consider taking another form of IV iron to rapidly replenish his iron stores.    Of note, this gentleman also has a history of seminoma, for which he underwent a orchiectomy in 2008, followed by BEP chemotherapy, which was completed in 2009.  Scans and exams since then never showed signs of disease recurrence.  He has been considered cured of this disease.    PHYSICAL EXAM:  Blood pressure (!) 147/80, pulse 76, temperature 98.2 F (36.8 C), temperature source Oral, resp. rate 20, height '5\' 10"'$  (1.778 m), weight 231 lb 11.2 oz (105.1 kg), SpO2 99 %. Wt Readings from Last 3 Encounters:  01/20/22 231 lb 11.2 oz (105.1 kg)  11/30/21 250 lb (113.4 kg)  07/19/21 254 lb 1.9 oz (115.3 kg)   Body mass index is 33.25 kg/m. Performance status (ECOG): 0 Physical  Exam Constitutional:      Appearance: Normal appearance. He is not ill-appearing.  HENT:     Mouth/Throat:     Mouth: Mucous membranes are moist.     Pharynx: Oropharynx is clear. No oropharyngeal exudate or posterior oropharyngeal erythema.  Cardiovascular:     Rate and Rhythm: Normal rate and regular rhythm.     Heart sounds: No murmur heard.    No friction rub. No gallop.  Pulmonary:     Effort: Pulmonary effort is normal. No respiratory distress.     Breath sounds: Normal breath sounds. No wheezing, rhonchi or rales.  Abdominal:     General: Bowel sounds are normal. There is no distension.     Palpations: Abdomen is soft. There is no mass.     Tenderness: There is no abdominal tenderness.  Musculoskeletal:        General: No swelling.     Right lower leg: No edema.     Left lower leg: No edema.  Lymphadenopathy:     Cervical: No cervical adenopathy.     Upper Body:     Right upper body: No supraclavicular or axillary adenopathy.     Left upper body: No supraclavicular or axillary adenopathy.     Lower Body: No right inguinal adenopathy. No left inguinal adenopathy.  Skin:    General: Skin is warm.     Coloration: Skin is not jaundiced.     Findings: No lesion  or rash.  Neurological:     General: No focal deficit present.     Mental Status: He is alert and oriented to person, place, and time. Mental status is at baseline.  Psychiatric:        Mood and Affect: Mood normal.        Behavior: Behavior normal.        Thought Content: Thought content normal.   LABS:   ASSESSMENT & PLAN:  Assessment/Plan:  A 56 y.o. 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  However, once again, his much more pressing issue currently is his persistent iron deficiency anemia.  As mentioned previously, he did receive 1 unit of blood earlier this week.  I will now arrange for him to receive  another formulation of IV iron, which will be Monoferric.  The one advantage of his iron formulation is that only 1 dose is needed to replenish one's iron stores.  Even if this patient has an allergic reaction to it, hopefully the dose he will have received will have dramatically improved his iron stores and lead to an increase in his hemoglobin level.  The patient understands I will give him premedications, including steroids and histamine blockers, to prevent any significant allergic reaction from developing.  He also has Benadryl to take at home as needed.  As his iron deficiency anemia remains severe, I will refer him to a local gastroenterologist so he can undergo a repeat GI workup to ensure there is no adverse GI tract pathology present.  With respect to his base of tongue cancer, he remains disease-free.  He is now 4-1/2 years out from undergoing his definitive chemoradiation.  He understands that if he remains free of this disease for 5 total years, he will ultimately be considered cured of his base of tongue cancer. I will see this patient back in 3 months to reassess his iron and hemoglobin levels to see how well he responded to his upcoming IV Monoferric formulation.  The patient understands all the plans discussed today and is in agreement with them.    Daymien Goth Macarthur Critchley, MD

## 2022-01-20 ENCOUNTER — Other Ambulatory Visit: Payer: Self-pay | Admitting: Oncology

## 2022-01-20 ENCOUNTER — Telehealth: Payer: Self-pay | Admitting: Oncology

## 2022-01-20 ENCOUNTER — Inpatient Hospital Stay: Payer: BC Managed Care – PPO | Attending: Oncology | Admitting: Oncology

## 2022-01-20 ENCOUNTER — Other Ambulatory Visit: Payer: BC Managed Care – PPO

## 2022-01-20 ENCOUNTER — Encounter: Payer: Self-pay | Admitting: Oncology

## 2022-01-20 VITALS — BP 147/80 | HR 76 | Temp 98.2°F | Resp 20 | Ht 70.0 in | Wt 231.7 lb

## 2022-01-20 DIAGNOSIS — D509 Iron deficiency anemia, unspecified: Secondary | ICD-10-CM | POA: Insufficient documentation

## 2022-01-20 DIAGNOSIS — D508 Other iron deficiency anemias: Secondary | ICD-10-CM

## 2022-01-20 DIAGNOSIS — C01 Malignant neoplasm of base of tongue: Secondary | ICD-10-CM

## 2022-01-20 DIAGNOSIS — Z85810 Personal history of malignant neoplasm of tongue: Secondary | ICD-10-CM | POA: Insufficient documentation

## 2022-01-20 DIAGNOSIS — Z923 Personal history of irradiation: Secondary | ICD-10-CM | POA: Insufficient documentation

## 2022-01-20 DIAGNOSIS — Z9221 Personal history of antineoplastic chemotherapy: Secondary | ICD-10-CM | POA: Insufficient documentation

## 2022-01-20 NOTE — Telephone Encounter (Signed)
Patient has been scheduled. Aware of appt date and time    Scheduling Message Entered by Ulice Dash M on 01/20/2022 at 11:54 AM Priority: High <No visit type provided>  Department: [facility]  Provider:  Appointment Notes:  Please schedule pt for lab appt asap.  His insurance will not approve the IV iron without having iron studies within the last 3 weeks.  Scheduling Notes:

## 2022-01-24 ENCOUNTER — Encounter: Payer: Self-pay | Admitting: Oncology

## 2022-01-24 MED FILL — Ferric Derisomaltose (One Dose) IV Sol 1000 MG/10ML (Fe Eq): INTRAVENOUS | Qty: 10 | Status: AC

## 2022-01-25 ENCOUNTER — Inpatient Hospital Stay: Payer: BC Managed Care – PPO

## 2022-01-25 VITALS — BP 149/74 | HR 77 | Temp 98.0°F | Resp 18 | Ht 70.0 in | Wt 226.0 lb

## 2022-01-25 DIAGNOSIS — Z923 Personal history of irradiation: Secondary | ICD-10-CM | POA: Diagnosis not present

## 2022-01-25 DIAGNOSIS — D509 Iron deficiency anemia, unspecified: Secondary | ICD-10-CM | POA: Diagnosis not present

## 2022-01-25 DIAGNOSIS — Z9221 Personal history of antineoplastic chemotherapy: Secondary | ICD-10-CM | POA: Diagnosis not present

## 2022-01-25 DIAGNOSIS — Z85810 Personal history of malignant neoplasm of tongue: Secondary | ICD-10-CM | POA: Diagnosis not present

## 2022-01-25 MED ORDER — SODIUM CHLORIDE 0.9 % IV SOLN: Freq: Once | INTRAVENOUS | Status: AC

## 2022-01-25 MED ORDER — SODIUM CHLORIDE 0.9 % IV SOLN
1000.0000 mg | Freq: Once | INTRAVENOUS | Status: AC
  Administered 2022-01-25: 1000 mg via INTRAVENOUS
  Filled 2022-01-25: qty 10

## 2022-01-25 MED ORDER — METHYLPREDNISOLONE SODIUM SUCC 125 MG IJ SOLR
125.0000 mg | Freq: Once | INTRAMUSCULAR | Status: AC
  Administered 2022-01-25: 125 mg via INTRAVENOUS
  Filled 2022-01-25: qty 2

## 2022-01-25 MED ORDER — FAMOTIDINE IN NACL 20-0.9 MG/50ML-% IV SOLN
20.0000 mg | Freq: Once | INTRAVENOUS | Status: AC
  Administered 2022-01-25: 20 mg via INTRAVENOUS
  Filled 2022-01-25: qty 50

## 2022-01-25 MED ORDER — DIPHENHYDRAMINE HCL 50 MG/ML IJ SOLN
25.0000 mg | Freq: Once | INTRAMUSCULAR | Status: AC
  Administered 2022-01-25: 25 mg via INTRAVENOUS
  Filled 2022-01-25: qty 1

## 2022-01-25 NOTE — Patient Instructions (Signed)

## 2022-04-20 NOTE — Progress Notes (Signed)
[facility]  [address] [place],  [place]  [address] [phone]  Clinic Day:  01/20/2022  Referring physician: Dorna Mai, MD  HISTORY OF PRESENT ILLNESS:  The patient is a 57 y.o. male with severe iron deficiency anemia.  He comes in today to reassess this after receiving a course of IV iron.  The patient claims to have tolerated IV Monferric very well.  He continues to deny having any overt forms of blood loss.  He continues to take 2 iron pills daily.    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  He completed concurrent chemoradiation, which consisted of weekly carboplatin/paclitaxel, in May 2019.  All scans and physical exams since then have shown no signs of disease recurrence.  He denies having any new symptoms/findings which concern him for late disease recurrence.  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  Scans and exams since then never showed signs of disease recurrence.  He has been considered cured of this disease.    PHYSICAL EXAM:  Blood pressure (!) 159/77, pulse 71, temperature 98.2 F (36.8 C), resp. rate 18, height '5\' 10"'$  (1.778 m), weight 226 lb 6.4 oz (102.7 kg), SpO2 100 %. Wt Readings from Last 3 Encounters:  04/21/22 226 lb 6.4 oz (102.7 kg)  01/25/22 226 lb (102.5 kg)  01/20/22 231 lb 11.2 oz (105.1 kg)   Body mass index is 32.49 kg/m. Performance status (ECOG): 0 Physical Exam Constitutional:      Appearance: Normal appearance. He is not ill-appearing.  HENT:     Mouth/Throat:     Mouth: Mucous membranes are moist.     Pharynx: Oropharynx is clear. No oropharyngeal exudate or posterior oropharyngeal erythema.  Cardiovascular:     Rate and Rhythm: Normal rate and regular rhythm.     Heart sounds: No murmur heard.    No friction  rub. No gallop.  Pulmonary:     Effort: Pulmonary effort is normal. No respiratory distress.     Breath sounds: Normal breath sounds. No wheezing, rhonchi or rales.  Abdominal:     General: Bowel sounds are normal. There is no distension.     Palpations: Abdomen is soft. There is no mass.     Tenderness: There is no abdominal tenderness.  Musculoskeletal:        General: No swelling.     Right lower leg: No edema.     Left lower leg: No edema.  Lymphadenopathy:     Cervical: No cervical adenopathy.     Upper Body:     Right upper body: No supraclavicular or axillary adenopathy.     Left upper body: No supraclavicular or axillary adenopathy.     Lower Body: No right inguinal adenopathy. No left inguinal adenopathy.  Skin:    General: Skin is warm.     Coloration: Skin is not jaundiced.     Findings: No lesion or rash.  Neurological:     General: No focal deficit present.     Mental Status: He is alert and oriented to person, place, and time. Mental status is at baseline.  Psychiatric:        Mood and Affect: Mood normal.        Behavior: Behavior normal.  Thought Content: Thought content normal.  LABS:   Latest Reference Range & Units 04/21/22 09:31  Iron 45 - 182 ug/dL 43 (L)  UIBC ug/dL 425  TIBC 250 - 450 ug/dL 468 (H)  Saturation Ratios 17.9 - 39.5 % 9 (L)  Ferritin 24 - 336 ng/mL 3 (L)  (L): Data is abnormally low (H): Data is abnormally high  ASSESSMENT & PLAN:  Assessment/Plan:  A 57 y.o. 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  However, once again, his much more pressing issue currently is his persistent iron deficiency anemia.  As mentioned previously, he did receive 1 unit of blood earlier this week.  I will now arrange for him to receive another formulation of IV iron, which will be Monoferric.  The one advantage of his iron formulation is that only 1  dose is needed to replenish one's iron stores.  Even if this patient has an allergic reaction to it, hopefully the dose he will have received will have dramatically improved his iron stores and lead to an increase in his hemoglobin level.  The patient understands I will give him premedications, including steroids and histamine blockers, to prevent any significant allergic reaction from developing.  He also has Benadryl to take at home as needed.  As his iron deficiency anemia remains severe, I will refer him to a local gastroenterologist so he can undergo a repeat GI workup to ensure there is no adverse GI tract pathology present.  With respect to his base of tongue cancer, he remains disease-free.  He is now 4-1/2 years out from undergoing his definitive chemoradiation.  He understands that if he remains free of this disease for 5 total years, he will ultimately be considered cured of his base of tongue cancer. I will see this patient back in 3 months to reassess his iron and hemoglobin levels to see how well he responded to his upcoming IV Monoferric formulation.  The patient understands all the plans discussed today and is in agreement with them.    Matthew Corman Macarthur Critchley, MD

## 2022-04-21 ENCOUNTER — Other Ambulatory Visit: Payer: Self-pay | Admitting: Oncology

## 2022-04-21 ENCOUNTER — Inpatient Hospital Stay: Payer: BC Managed Care – PPO

## 2022-04-21 ENCOUNTER — Inpatient Hospital Stay: Payer: BC Managed Care – PPO | Attending: Oncology | Admitting: Oncology

## 2022-04-21 VITALS — BP 159/77 | HR 71 | Temp 98.2°F | Resp 18 | Ht 70.0 in | Wt 226.4 lb

## 2022-04-21 DIAGNOSIS — Z79899 Other long term (current) drug therapy: Secondary | ICD-10-CM | POA: Diagnosis not present

## 2022-04-21 DIAGNOSIS — Z923 Personal history of irradiation: Secondary | ICD-10-CM | POA: Diagnosis not present

## 2022-04-21 DIAGNOSIS — Z85810 Personal history of malignant neoplasm of tongue: Secondary | ICD-10-CM | POA: Diagnosis not present

## 2022-04-21 DIAGNOSIS — D509 Iron deficiency anemia, unspecified: Secondary | ICD-10-CM | POA: Insufficient documentation

## 2022-04-21 DIAGNOSIS — D508 Other iron deficiency anemias: Secondary | ICD-10-CM

## 2022-04-21 LAB — FERRITIN: Ferritin: 3 ng/mL — ABNORMAL LOW (ref 24–336)

## 2022-04-21 LAB — HEPATIC FUNCTION PANEL
ALT: 11 U/L (ref 10–40)
AST: 19 (ref 14–40)
Alkaline Phosphatase: 72 (ref 25–125)
Bilirubin, Total: 0.3

## 2022-04-21 LAB — COMPREHENSIVE METABOLIC PANEL
Albumin: 4 (ref 3.5–5.0)
Calcium: 8.4 — AB (ref 8.7–10.7)

## 2022-04-21 LAB — IRON AND TIBC
Iron: 43 ug/dL — ABNORMAL LOW (ref 45–182)
Saturation Ratios: 9 % — ABNORMAL LOW (ref 17.9–39.5)
TIBC: 468 ug/dL — ABNORMAL HIGH (ref 250–450)
UIBC: 425 ug/dL

## 2022-04-21 LAB — PREPARE RBC (CROSSMATCH)

## 2022-04-21 LAB — CBC AND DIFFERENTIAL
HCT: 21 — AB (ref 41–53)
Hemoglobin: 6.2 — AB (ref 13.5–17.5)
Neutrophils Absolute: 4.29
Platelets: 313 10*3/uL (ref 150–400)
WBC: 6.5

## 2022-04-21 LAB — BASIC METABOLIC PANEL
BUN: 17 (ref 4–21)
CO2: 31 — AB (ref 13–22)
Chloride: 101 (ref 99–108)
Creatinine: 0.7 (ref 0.6–1.3)
Glucose: 119
Potassium: 3.6 mEq/L (ref 3.5–5.1)
Sodium: 138 (ref 137–147)

## 2022-04-21 LAB — CBC: RBC: 3.33 — AB (ref 3.87–5.11)

## 2022-04-21 LAB — ABO/RH: ABO/RH(D): O POS

## 2022-04-21 NOTE — Progress Notes (Signed)
CRITICAL VALUE STICKER  CRITICAL VALUE:  Hgb 6.2  RECEIVER (on-site recipient of call):  Georgette Shell RN  [place] NOTIFIED:  04/21/2022 @ 1045  MESSENGER (representative from lab):    [facility]   MD NOTIFIED: Dr. Bobby Rumpf  TIME OF NOTIFICATION:  1051  RESPONSE: Dr. Bobby Rumpf scheduled to see patient.  Ordered 2 units PRBC to be transfused on Monday 04/24/2022 @ 0930 on [employer].

## 2022-04-23 ENCOUNTER — Encounter: Payer: Self-pay | Admitting: Oncology

## 2022-04-23 ENCOUNTER — Other Ambulatory Visit: Payer: Self-pay | Admitting: Oncology

## 2022-04-23 DIAGNOSIS — D509 Iron deficiency anemia, unspecified: Secondary | ICD-10-CM

## 2022-04-24 ENCOUNTER — Inpatient Hospital Stay: Payer: BC Managed Care – PPO

## 2022-04-24 DIAGNOSIS — Z79899 Other long term (current) drug therapy: Secondary | ICD-10-CM | POA: Diagnosis not present

## 2022-04-24 DIAGNOSIS — D509 Iron deficiency anemia, unspecified: Secondary | ICD-10-CM | POA: Diagnosis not present

## 2022-04-24 DIAGNOSIS — Z85810 Personal history of malignant neoplasm of tongue: Secondary | ICD-10-CM | POA: Diagnosis not present

## 2022-04-24 DIAGNOSIS — Z923 Personal history of irradiation: Secondary | ICD-10-CM | POA: Diagnosis not present

## 2022-04-24 MED ORDER — FAMOTIDINE IN NACL 20-0.9 MG/50ML-% IV SOLN
20.0000 mg | Freq: Once | INTRAVENOUS | Status: AC
  Administered 2022-04-24: 20 mg via INTRAVENOUS
  Filled 2022-04-24: qty 50

## 2022-04-24 MED ORDER — ACETAMINOPHEN 325 MG PO TABS
650.0000 mg | ORAL_TABLET | Freq: Once | ORAL | Status: AC
  Administered 2022-04-24: 650 mg via ORAL
  Filled 2022-04-24: qty 2

## 2022-04-24 MED ORDER — SODIUM CHLORIDE 0.9% IV SOLUTION
250.0000 mL | Freq: Once | INTRAVENOUS | Status: AC
  Administered 2022-04-24: 250 mL via INTRAVENOUS

## 2022-04-24 NOTE — Patient Instructions (Signed)

## 2022-04-25 LAB — BPAM RBC
Blood Product Expiration Date: 202404112359
Blood Product Expiration Date: 202404112359
ISSUE DATE / TIME: 202403110805
ISSUE DATE / TIME: 202403110805
Unit Type and Rh: 5100
Unit Type and Rh: 5100

## 2022-04-25 LAB — TYPE AND SCREEN
ABO/RH(D): O POS
Antibody Screen: NEGATIVE
Unit division: 0
Unit division: 0

## 2022-04-27 ENCOUNTER — Inpatient Hospital Stay: Payer: BC Managed Care – PPO

## 2022-04-27 VITALS — BP 122/67 | HR 67 | Temp 98.0°F | Resp 18 | Wt 226.0 lb

## 2022-04-27 DIAGNOSIS — Z85810 Personal history of malignant neoplasm of tongue: Secondary | ICD-10-CM | POA: Diagnosis not present

## 2022-04-27 DIAGNOSIS — D509 Iron deficiency anemia, unspecified: Secondary | ICD-10-CM | POA: Diagnosis not present

## 2022-04-27 DIAGNOSIS — Z79899 Other long term (current) drug therapy: Secondary | ICD-10-CM | POA: Diagnosis not present

## 2022-04-27 DIAGNOSIS — Z923 Personal history of irradiation: Secondary | ICD-10-CM | POA: Diagnosis not present

## 2022-04-27 MED ORDER — FAMOTIDINE IN NACL 20-0.9 MG/50ML-% IV SOLN
20.0000 mg | Freq: Once | INTRAVENOUS | Status: AC
  Administered 2022-04-27: 20 mg via INTRAVENOUS
  Filled 2022-04-27: qty 50

## 2022-04-27 MED ORDER — SODIUM CHLORIDE 0.9 % IV SOLN: Freq: Once | INTRAVENOUS | Status: AC

## 2022-04-27 MED ORDER — DIPHENHYDRAMINE HCL 50 MG/ML IJ SOLN
25.0000 mg | Freq: Once | INTRAMUSCULAR | Status: AC
  Administered 2022-04-27: 25 mg via INTRAVENOUS
  Filled 2022-04-27: qty 1

## 2022-04-27 MED ORDER — SODIUM CHLORIDE 0.9 % IV SOLN
1000.0000 mg | Freq: Once | INTRAVENOUS | Status: AC
  Administered 2022-04-27: 1000 mg via INTRAVENOUS
  Filled 2022-04-27: qty 10

## 2022-04-27 MED ORDER — ACETAMINOPHEN 325 MG PO TABS
650.0000 mg | ORAL_TABLET | Freq: Once | ORAL | Status: AC
  Administered 2022-04-27: 650 mg via ORAL
  Filled 2022-04-27: qty 2

## 2022-04-27 MED ORDER — METHYLPREDNISOLONE SODIUM SUCC 125 MG IJ SOLR
125.0000 mg | Freq: Once | INTRAMUSCULAR | Status: AC
  Administered 2022-04-27: 125 mg via INTRAVENOUS
  Filled 2022-04-27: qty 2

## 2022-04-27 NOTE — Patient Instructions (Signed)

## 2022-05-01 ENCOUNTER — Encounter: Payer: Self-pay | Admitting: Oncology

## 2022-05-26 ENCOUNTER — Ambulatory Visit (INDEPENDENT_AMBULATORY_CARE_PROVIDER_SITE_OTHER): Payer: BC Managed Care – PPO | Admitting: Family Medicine

## 2022-05-26 ENCOUNTER — Encounter: Payer: Self-pay | Admitting: Family Medicine

## 2022-05-26 VITALS — BP 137/84 | HR 74 | Temp 98.1°F | Resp 16 | Wt 230.4 lb

## 2022-05-26 DIAGNOSIS — D508 Other iron deficiency anemias: Secondary | ICD-10-CM

## 2022-05-26 DIAGNOSIS — I10 Essential (primary) hypertension: Secondary | ICD-10-CM | POA: Diagnosis not present

## 2022-05-26 DIAGNOSIS — F419 Anxiety disorder, unspecified: Secondary | ICD-10-CM

## 2022-05-26 DIAGNOSIS — M47817 Spondylosis without myelopathy or radiculopathy, lumbosacral region: Secondary | ICD-10-CM | POA: Diagnosis not present

## 2022-05-26 DIAGNOSIS — F32A Depression, unspecified: Secondary | ICD-10-CM

## 2022-05-26 MED ORDER — AMLODIPINE BESYLATE 10 MG PO TABS: 10.0000 mg | ORAL_TABLET | Freq: Every day | ORAL | 1 refills | Status: DC

## 2022-05-26 MED ORDER — LISINOPRIL 10 MG PO TABS: 10.0000 mg | ORAL_TABLET | Freq: Every day | ORAL | 1 refills | Status: DC

## 2022-05-26 MED ORDER — HYDROCHLOROTHIAZIDE 25 MG PO TABS: 25.0000 mg | ORAL_TABLET | Freq: Every day | ORAL | 1 refills | Status: DC

## 2022-05-26 NOTE — Progress Notes (Signed)
Patient is here for their 6 month follow-up Patient has no concerns today Care gaps have been discussed with patient  -labs drawn

## 2022-05-27 LAB — CBC WITH DIFFERENTIAL/PLATELET
Basophils Absolute: 0.1 10*3/uL (ref 0.0–0.2)
Basos: 1 %
EOS (ABSOLUTE): 0.6 10*3/uL — ABNORMAL HIGH (ref 0.0–0.4)
Eos: 8 %
Hematocrit: 38.5 % (ref 37.5–51.0)
Hemoglobin: 11.3 g/dL — ABNORMAL LOW (ref 13.0–17.7)
Immature Grans (Abs): 0 10*3/uL (ref 0.0–0.1)
Immature Granulocytes: 0 %
Lymphocytes Absolute: 0.6 10*3/uL — ABNORMAL LOW (ref 0.7–3.1)
Lymphs: 9 %
MCH: 23.3 pg — ABNORMAL LOW (ref 26.6–33.0)
MCHC: 29.4 g/dL — ABNORMAL LOW (ref 31.5–35.7)
MCV: 80 fL (ref 79–97)
Monocytes Absolute: 0.6 10*3/uL (ref 0.1–0.9)
Monocytes: 8 %
Neutrophils Absolute: 5.1 10*3/uL (ref 1.4–7.0)
Neutrophils: 74 %
Platelets: 322 10*3/uL (ref 150–450)
RBC: 4.84 x10E6/uL (ref 4.14–5.80)
RDW: 25.8 % — ABNORMAL HIGH (ref 11.6–15.4)
WBC: 7 10*3/uL (ref 3.4–10.8)

## 2022-05-31 ENCOUNTER — Encounter: Payer: Self-pay | Admitting: Family Medicine

## 2022-05-31 NOTE — Progress Notes (Signed)
Established Patient Office Visit  Subjective    Patient ID: Matthew Hatfield, male    DOB: 1965-06-24  Age: 57 y.o. MRN: [id number]  CC:  Chief Complaint  Patient presents with   Follow-up    HPI Matthew Hatfield presents for routine follow up of chronic med issues. Patient denies acute complaints or concerns.    Outpatient Encounter Medications as of 05/26/2022  Medication Sig   aspirin 81 MG chewable tablet Chew 81 mg by mouth.   docusate sodium (COLACE) 100 MG capsule Take 100 mg by mouth 3 (three) times daily.   doxycycline (VIBRAMYCIN) 100 MG capsule Take 1 capsule (100 mg total) by mouth 2 (two) times daily.   ferrous sulfate 325 (65 FE) MG EC tablet Take 325 mg by mouth 3 (three) times daily with meals.   hydrOXYzine (ATARAX) 25 MG tablet Take 1 tablet (25 mg total) by mouth every 6 (six) hours.   meloxicam (MOBIC) 15 MG tablet take 1 tablet by oral route  every day with food   PARoxetine (PAXIL) 20 MG tablet Take 1 tablet (20 mg total) by mouth daily.   PARoxetine (PAXIL) 30 MG tablet Take 1 tablet (30 mg total) by mouth daily.   [DISCONTINUED] amLODipine (NORVASC) 10 MG tablet Take 1 tablet (10 mg total) by mouth daily.   [DISCONTINUED] hydrochlorothiazide (HYDRODIURIL) 25 MG tablet Take 1 tablet (25 mg total) by mouth daily.   [DISCONTINUED] lisinopril (ZESTRIL) 10 MG tablet Take 1 tablet (10 mg total) by mouth daily.   amLODipine (NORVASC) 10 MG tablet Take 1 tablet (10 mg total) by mouth daily.   atorvastatin (LIPITOR) 40 MG tablet Take 1 tablet (40 mg total) by mouth daily.   hydrochlorothiazide (HYDRODIURIL) 25 MG tablet Take 1 tablet (25 mg total) by mouth daily.   lisinopril (ZESTRIL) 10 MG tablet Take 1 tablet (10 mg total) by mouth daily.   No facility-administered encounter medications on file as of 05/26/2022.    Past Medical History:  Diagnosis Date   Cancer of base of tongue    GERD (gastroesophageal reflux disease)    Hyperlipidemia    Hypertension     Personal history of testicular cancer    Throat cancer     Past Surgical History:  Procedure Laterality Date   CHOLECYSTECTOMY     COLONOSCOPY     HERNIA REPAIR     KNEE SURGERY     RADICAL ORCHIECTOMY Right    TONSILLECTOMY      Family History  Problem Relation Age of Onset   Cancer Mother    Heart disease Mother    Lung cancer Father    Diabetes Maternal Uncle    Colon polyps Neg Hx    Colon cancer Neg Hx    Esophageal cancer Neg Hx    Stomach cancer Neg Hx    Rectal cancer Neg Hx     Social History   Socioeconomic History   Marital status: Widowed    Spouse name: Not on file   Number of children: Not on file   Years of education: Not on file   Highest education level: Not on file  Occupational History   Not on file  Tobacco Use   Smoking status: Never   Smokeless tobacco: Never  Vaping Use   Vaping Use: Never used  Substance and Sexual Activity   Alcohol use: No   Drug use: No   Sexual activity: Not on file  Other Topics Concern   Not on file  Social History Narrative   Not on file   Social Determinants of Health   Financial Resource Strain: Low Risk  (05/26/2022)   Overall Financial Resource Strain (CARDIA)    Difficulty of Paying Living Expenses: Not hard at all  Food Insecurity: No Food Insecurity (05/26/2022)   Hunger Vital Sign    Worried About Running Out of Food in the Last Year: Never true    Ran Out of Food in the Last Year: Never true  Transportation Needs: No Transportation Needs (05/26/2022)   PRAPARE - Administrator, Civil Service (Medical): No    Lack of Transportation (Non-Medical): No  Physical Activity: Insufficiently Active (05/26/2022)   Exercise Vital Sign    Days of Exercise per Week: 4 days    Minutes of Exercise per Session: 30 min  Stress: No Stress Concern Present (05/26/2022)   [employer] of Occupational Health - Occupational Stress Questionnaire    Feeling of Stress : Not at all  Social Connections:  Unknown (05/26/2022)   Social Connection and Isolation Panel [NHANES]    Frequency of Communication with Friends and Family: Three times a week    Frequency of Social Gatherings with Friends and Family: Three times a week    Attends Religious Services: More than 4 times per year    Active Member of Clubs or Organizations: No    Attends Banker Meetings: 1 to 4 times per year    Marital Status: Not on file  Intimate Partner Violence: Not At Risk (05/26/2022)   Humiliation, Afraid, Rape, and Kick questionnaire    Fear of Current or Ex-Partner: No    Emotionally Abused: No    Physically Abused: No    Sexually Abused: No    Review of Systems  All other systems reviewed and are negative.       Objective    BP 137/84   Pulse 74   Temp 98.1 F (36.7 C) (Oral)   Resp 16   Wt 230 lb 6.4 oz (104.5 kg)   BMI 33.06 kg/m   Physical Exam Vitals and nursing note reviewed.  Constitutional:      General: He is not in acute distress. Cardiovascular:     Rate and Rhythm: Normal rate and regular rhythm.  Pulmonary:     Effort: Pulmonary effort is normal.     Breath sounds: Normal breath sounds.  Abdominal:     Palpations: Abdomen is soft.     Tenderness: There is no abdominal tenderness.  Neurological:     General: No focal deficit present.     Mental Status: He is alert and oriented to person, place, and time.  Psychiatric:        Mood and Affect: Mood and affect normal.         Assessment & Plan:   1. Essential hypertension Appears stable. Continue. Meds refilled.  - lisinopril (ZESTRIL) 10 MG tablet; Take 1 tablet (10 mg total) by mouth daily.  Dispense: 90 tablet; Refill: 1 - hydrochlorothiazide (HYDRODIURIL) 25 MG tablet; Take 1 tablet (25 mg total) by mouth daily.  Dispense: 90 tablet; Refill: 1 - amLODipine (NORVASC) 10 MG tablet; Take 1 tablet (10 mg total) by mouth daily.  Dispense: 90 tablet; Refill: 1  2. Other iron deficiency anemia Monitoring labs  ordered - CBC with Differential  3. Anxiety and depression Appears stable.   4. Lumbosacral spondylosis without myelopathy Patient plans on further therapy/surgery that is requiring he has HGB at certain  level. Management as per consultant.     Return in about 6 months (around 11/25/2022) for follow up.   Tommie Raymond, MD

## 2022-06-01 ENCOUNTER — Ambulatory Visit: Payer: BC Managed Care – PPO | Admitting: Family Medicine

## 2022-06-02 ENCOUNTER — Encounter: Payer: Self-pay | Admitting: Oncology

## 2022-06-02 DIAGNOSIS — M25461 Effusion, right knee: Secondary | ICD-10-CM | POA: Diagnosis not present

## 2022-06-02 DIAGNOSIS — M25561 Pain in right knee: Secondary | ICD-10-CM | POA: Diagnosis not present

## 2022-06-02 DIAGNOSIS — I10 Essential (primary) hypertension: Secondary | ICD-10-CM | POA: Diagnosis not present

## 2022-06-02 NOTE — Telephone Encounter (Signed)
Done

## 2022-06-13 DIAGNOSIS — M25561 Pain in right knee: Secondary | ICD-10-CM | POA: Diagnosis not present

## 2022-06-13 DIAGNOSIS — M1711 Unilateral primary osteoarthritis, right knee: Secondary | ICD-10-CM | POA: Diagnosis not present

## 2022-06-13 DIAGNOSIS — M25461 Effusion, right knee: Secondary | ICD-10-CM | POA: Diagnosis not present

## 2022-06-15 DIAGNOSIS — D649 Anemia, unspecified: Secondary | ICD-10-CM | POA: Diagnosis not present

## 2022-06-15 DIAGNOSIS — M47816 Spondylosis without myelopathy or radiculopathy, lumbar region: Secondary | ICD-10-CM | POA: Diagnosis not present

## 2022-06-15 DIAGNOSIS — Z1331 Encounter for screening for depression: Secondary | ICD-10-CM | POA: Diagnosis not present

## 2022-06-19 DIAGNOSIS — M25561 Pain in right knee: Secondary | ICD-10-CM | POA: Diagnosis not present

## 2022-06-26 DIAGNOSIS — M25561 Pain in right knee: Secondary | ICD-10-CM | POA: Diagnosis not present

## 2022-06-26 DIAGNOSIS — M25461 Effusion, right knee: Secondary | ICD-10-CM | POA: Diagnosis not present

## 2022-06-26 DIAGNOSIS — M1711 Unilateral primary osteoarthritis, right knee: Secondary | ICD-10-CM | POA: Diagnosis not present

## 2022-07-20 NOTE — Progress Notes (Signed)
[facility]  [address] [place],  [place]  [address] [phone]  Clinic Day:  07/21/2022  Referring physician: Georganna Skeans, MD  HISTORY OF PRESENT ILLNESS:  The patient is a 57 y.o. male with severe iron deficiency anemia.  He comes in today to reassess his iron and hemoglobin after receiving a course of IV iron.  The patient claims to have tolerated IV Monferric very well.  He continues to deny having any overt forms of blood loss.  Of note, he takes liquid iron on a daily basis.  The patient has yet to undergo his GI workup.  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  He completed concurrent chemoradiation, which consisted of weekly carboplatin/paclitaxel, in May 2019.  All scans and physical exams since then have shown no signs of disease recurrence.  He denies having any new symptoms/findings which concern him for late disease recurrence.  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  Scans and exams since then never showed signs of disease recurrence.  He has been considered cured of this disease.    PHYSICAL EXAM:  Blood pressure (!) 152/82, pulse 62, temperature 98.4 F (36.9 C), resp. rate 16, height 5\' 10"  (1.778 m), weight 227 lb 9.6 oz (103.2 kg), SpO2 100 %. Wt Readings from Last 3 Encounters:  07/21/22 227 lb 9.6 oz (103.2 kg)  05/26/22 230 lb 6.4 oz (104.5 kg)  04/27/22 226 lb (102.5 kg)   Body mass index is 32.66 kg/m. Performance status (ECOG): 0 Physical Exam Constitutional:      Appearance: Normal appearance. He is not ill-appearing.  HENT:     Mouth/Throat:     Mouth: Mucous membranes are moist.     Pharynx: Oropharynx is clear. No oropharyngeal exudate or posterior oropharyngeal erythema.  Cardiovascular:      Rate and Rhythm: Normal rate and regular rhythm.     Heart sounds: No murmur heard.    No friction rub. No gallop.  Pulmonary:     Effort: Pulmonary effort is normal. No respiratory distress.     Breath sounds: Normal breath sounds. No wheezing, rhonchi or rales.  Abdominal:     General: Bowel sounds are normal. There is no distension.     Palpations: Abdomen is soft. There is no mass.     Tenderness: There is no abdominal tenderness.  Musculoskeletal:        General: No swelling.     Right lower leg: No edema.     Left lower leg: No edema.  Lymphadenopathy:     Cervical: No cervical adenopathy.     Upper Body:     Right upper body: No supraclavicular or axillary adenopathy.     Left upper body: No supraclavicular or axillary adenopathy.     Lower Body: No right inguinal adenopathy. No left inguinal adenopathy.  Skin:    General: Skin is warm.     Coloration: Skin is not jaundiced.     Findings: No lesion or rash.  Neurological:     General: No focal deficit present.     Mental Status: He is alert and oriented to person, place, and time. Mental status is at baseline.  Psychiatric:  Mood and Affect: Mood normal.        Behavior: Behavior normal.        Thought Content: Thought content normal.   LABS:  Latest Reference Range & Units 07/21/22 00:00  WBC  6.5 (E)  RBC 3.87 - 5.11  4.30 (E)  Hemoglobin 13.5 - 17.5  8.8 ! (E)  HCT 41 - 53  29 ! (E)  Platelets 150 - 400 K/uL 302 (E)  NEUT#  4.23 (E)  !: Data is abnormal (E): External lab result   Latest Reference Range & Units 07/21/22 10:05  Iron 45 - 182 ug/dL 12 (L)  UIBC ug/dL 161  TIBC 096 - 045 ug/dL 409 (H)  Saturation Ratios 17.9 - 39.5 % 3 (L)  Ferritin 24 - 336 ng/mL 6 (L)  (L): Data is abnormally low (H): Data is abnormally high  ASSESSMENT & PLAN:  Assessment/Plan:  A 57 y.o. male with severe iron deficiency anemia.  Although this gentleman's hemoglobin is better, his hemoglobin remains very  suboptimal.  Based upon this, I will arrange for him to receive another course of Monoferric iron next week, which is the only IV iron formulation he has tolerated without having any significant difficulties.  As mentioned previously, his GI evaluation is set for July 2024, which he knows to keep.  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  As he is now 5 years cancer free from this malignancy, I consider him cured of his base of tongue cancer.  I will see this patient back in 3 months to see how well he responded to another course of Monoferric IV iron.  The patient understands all the plans discussed today and is in agreement with them.    Aneshia Jacquet Kirby Funk, MD

## 2022-07-21 ENCOUNTER — Inpatient Hospital Stay: Payer: BC Managed Care – PPO

## 2022-07-21 ENCOUNTER — Encounter: Payer: Self-pay | Admitting: Oncology

## 2022-07-21 ENCOUNTER — Inpatient Hospital Stay: Payer: BC Managed Care – PPO | Attending: Oncology | Admitting: Oncology

## 2022-07-21 VITALS — BP 152/82 | HR 62 | Temp 98.4°F | Resp 16 | Ht 70.0 in | Wt 227.6 lb

## 2022-07-21 DIAGNOSIS — Z79899 Other long term (current) drug therapy: Secondary | ICD-10-CM | POA: Diagnosis not present

## 2022-07-21 DIAGNOSIS — Z923 Personal history of irradiation: Secondary | ICD-10-CM | POA: Insufficient documentation

## 2022-07-21 DIAGNOSIS — D509 Iron deficiency anemia, unspecified: Secondary | ICD-10-CM | POA: Diagnosis not present

## 2022-07-21 DIAGNOSIS — C01 Malignant neoplasm of base of tongue: Secondary | ICD-10-CM | POA: Diagnosis not present

## 2022-07-21 DIAGNOSIS — Z85810 Personal history of malignant neoplasm of tongue: Secondary | ICD-10-CM | POA: Insufficient documentation

## 2022-07-21 LAB — CMP (CANCER CENTER ONLY)
ALT: 13 U/L (ref 0–44)
AST: 14 U/L — ABNORMAL LOW (ref 15–41)
Albumin: 3.9 g/dL (ref 3.5–5.0)
Alkaline Phosphatase: 87 U/L (ref 38–126)
Anion gap: 8 (ref 5–15)
BUN: 22 mg/dL — ABNORMAL HIGH (ref 6–20)
CO2: 29 mmol/L (ref 22–32)
Calcium: 8.6 mg/dL — ABNORMAL LOW (ref 8.9–10.3)
Chloride: 102 mmol/L (ref 98–111)
Creatinine: 0.67 mg/dL (ref 0.61–1.24)
GFR, Estimated: >60 mL/min (ref >60)
Glucose, Bld: 96 mg/dL (ref 70–99)
Potassium: 3.5 mmol/L (ref 3.5–5.1)
Sodium: 139 mmol/L (ref 135–145)
Total Bilirubin: 0.2 mg/dL — ABNORMAL LOW (ref 0.3–1.2)
Total Protein: 7 g/dL (ref 6.5–8.1)

## 2022-07-21 LAB — CBC: RBC: 4.3 (ref 3.87–5.11)

## 2022-07-21 LAB — FERRITIN: Ferritin: 6 ng/mL — ABNORMAL LOW (ref 24–336)

## 2022-07-21 LAB — IRON AND TIBC
Iron: 12 ug/dL — ABNORMAL LOW (ref 45–182)
Saturation Ratios: 3 % — ABNORMAL LOW (ref 17.9–39.5)
TIBC: 477 ug/dL — ABNORMAL HIGH (ref 250–450)
UIBC: 465 ug/dL

## 2022-07-21 LAB — CBC AND DIFFERENTIAL
HCT: 29 — AB (ref 41–53)
Hemoglobin: 8.8 — AB (ref 13.5–17.5)
Neutrophils Absolute: 4.23
Platelets: 302 10*3/uL (ref 150–400)
WBC: 6.5

## 2022-07-26 ENCOUNTER — Telehealth: Payer: Self-pay | Admitting: Oncology

## 2022-07-26 NOTE — Telephone Encounter (Signed)
07/26/22 Spoke with patient and resched inf appt.Verified with Toni Amend.

## 2022-07-27 ENCOUNTER — Telehealth: Payer: Self-pay | Admitting: Oncology

## 2022-07-27 ENCOUNTER — Other Ambulatory Visit: Payer: Self-pay | Admitting: Pharmacist

## 2022-07-27 ENCOUNTER — Inpatient Hospital Stay: Payer: BC Managed Care – PPO

## 2022-07-27 VITALS — BP 120/65 | HR 71 | Temp 98.4°F | Resp 18

## 2022-07-27 DIAGNOSIS — Z85810 Personal history of malignant neoplasm of tongue: Secondary | ICD-10-CM | POA: Diagnosis not present

## 2022-07-27 DIAGNOSIS — Z79899 Other long term (current) drug therapy: Secondary | ICD-10-CM | POA: Diagnosis not present

## 2022-07-27 DIAGNOSIS — Z923 Personal history of irradiation: Secondary | ICD-10-CM | POA: Diagnosis not present

## 2022-07-27 DIAGNOSIS — D509 Iron deficiency anemia, unspecified: Secondary | ICD-10-CM

## 2022-07-27 MED ORDER — FAMOTIDINE IN NACL 20-0.9 MG/50ML-% IV SOLN
20.0000 mg | Freq: Once | INTRAVENOUS | Status: AC
  Administered 2022-07-27: 20 mg via INTRAVENOUS
  Filled 2022-07-27: qty 50

## 2022-07-27 MED ORDER — METHYLPREDNISOLONE SODIUM SUCC 125 MG IJ SOLR
125.0000 mg | Freq: Once | INTRAMUSCULAR | Status: AC
  Administered 2022-07-27: 125 mg via INTRAVENOUS
  Filled 2022-07-27: qty 2

## 2022-07-27 MED ORDER — SODIUM CHLORIDE 0.9 % IV SOLN: Freq: Once | INTRAVENOUS | Status: AC

## 2022-07-27 MED ORDER — DIPHENHYDRAMINE HCL 50 MG/ML IJ SOLN
25.0000 mg | Freq: Once | INTRAMUSCULAR | Status: AC
  Administered 2022-07-27: 25 mg via INTRAVENOUS
  Filled 2022-07-27: qty 1

## 2022-07-27 MED ORDER — SODIUM CHLORIDE 0.9 % IV SOLN
1000.0000 mg | Freq: Once | INTRAVENOUS | Status: AC
  Administered 2022-07-27: 1000 mg via INTRAVENOUS
  Filled 2022-07-27: qty 10

## 2022-07-27 NOTE — Telephone Encounter (Signed)
error 

## 2022-07-27 NOTE — Telephone Encounter (Signed)
07/27/22 Patient rescheduled inf appt.Spoke with Toni Amend to verify(was not sure of the drug and she stated it was Iron)Rescheduled appt per patient request due to other appt.

## 2022-07-27 NOTE — Patient Instructions (Signed)

## 2022-08-03 ENCOUNTER — Ambulatory Visit: Payer: BC Managed Care – PPO

## 2022-08-10 DIAGNOSIS — M5136 Other intervertebral disc degeneration, lumbar region: Secondary | ICD-10-CM | POA: Diagnosis not present

## 2022-09-09 DIAGNOSIS — L03115 Cellulitis of right lower limb: Secondary | ICD-10-CM | POA: Diagnosis not present

## 2022-09-15 ENCOUNTER — Telehealth: Payer: Self-pay | Admitting: Family Medicine

## 2022-09-15 NOTE — Telephone Encounter (Signed)
Called pt to schedule appt for concerns of insect bite reopening; provider will not be in office until 08/12 and our nurse practitioner does not have appts available until 08/13. Pt stated he has an appt scheduled for a dermatologist on 08/12 and so he will wait to see the dermatologist and then give [place] a call to schedule with PCP.

## 2022-09-20 ENCOUNTER — Emergency Department (HOSPITAL_COMMUNITY): Payer: BC Managed Care – PPO

## 2022-09-20 ENCOUNTER — Emergency Department (HOSPITAL_COMMUNITY)
Admission: EM | Admit: 2022-09-20 | Discharge: 2022-09-20 | Disposition: A | Discharge status: Home / Self Care | Payer: BC Managed Care – PPO | Attending: Emergency Medicine | Admitting: Emergency Medicine

## 2022-09-20 ENCOUNTER — Encounter (HOSPITAL_COMMUNITY): Payer: Self-pay | Admitting: Emergency Medicine

## 2022-09-20 ENCOUNTER — Ambulatory Visit
Admission: EM | Admit: 2022-09-20 | Discharge: 2022-09-20 | Disposition: A | Discharge status: Home / Self Care | Payer: BC Managed Care – PPO

## 2022-09-20 ENCOUNTER — Other Ambulatory Visit: Payer: Self-pay

## 2022-09-20 DIAGNOSIS — R0789 Other chest pain: Secondary | ICD-10-CM | POA: Diagnosis not present

## 2022-09-20 DIAGNOSIS — Z7982 Long term (current) use of aspirin: Secondary | ICD-10-CM | POA: Insufficient documentation

## 2022-09-20 DIAGNOSIS — D649 Anemia, unspecified: Secondary | ICD-10-CM | POA: Diagnosis not present

## 2022-09-20 DIAGNOSIS — R079 Chest pain, unspecified: Secondary | ICD-10-CM | POA: Diagnosis not present

## 2022-09-20 LAB — BASIC METABOLIC PANEL WITH GFR
Anion gap: 10 (ref 5–15)
BUN: 16 mg/dL (ref 6–20)
CO2: 28 mmol/L (ref 22–32)
Calcium: 9 mg/dL (ref 8.9–10.3)
Chloride: 99 mmol/L (ref 98–111)
Creatinine, Ser: 0.7 mg/dL (ref 0.61–1.24)
GFR, Estimated: >60 mL/min
Glucose, Bld: 94 mg/dL (ref 70–99)
Potassium: 3.3 mmol/L — ABNORMAL LOW (ref 3.5–5.1)
Sodium: 137 mmol/L (ref 135–145)

## 2022-09-20 LAB — TROPONIN I (HIGH SENSITIVITY): Troponin I (High Sensitivity): 4 ng/L

## 2022-09-20 LAB — CBC
HCT: 27 % — ABNORMAL LOW (ref 39.0–52.0)
Hemoglobin: 7.7 g/dL — ABNORMAL LOW (ref 13.0–17.0)
MCH: 19.5 pg — ABNORMAL LOW (ref 26.0–34.0)
MCHC: 28.5 g/dL — ABNORMAL LOW (ref 30.0–36.0)
MCV: 68.4 fL — ABNORMAL LOW (ref 80.0–100.0)
Platelets: 410 10*3/uL — ABNORMAL HIGH (ref 150–400)
RBC: 3.95 MIL/uL — ABNORMAL LOW (ref 4.22–5.81)
RDW: 22.9 % — ABNORMAL HIGH (ref 11.5–15.5)
WBC: 10.4 10*3/uL (ref 4.0–10.5)
nRBC: 0 % (ref 0.0–0.2)

## 2022-09-20 MED ORDER — OMEPRAZOLE 20 MG PO CPDR: 20.0000 mg | DELAYED_RELEASE_CAPSULE | Freq: Every day | ORAL | 1 refills | Status: AC

## 2022-09-20 NOTE — ED Triage Notes (Signed)
Pt reports chest pain that started this morning while driving. Pt reports "it felt like indigestion and when I burped the pain went away and then come back." PT reports he was sent from [facility] for further work up.

## 2022-09-20 NOTE — ED Provider Notes (Signed)
[facility]    CSN: [id number] Arrival date & time: 09/20/22  1418      History   Chief Complaint Chief Complaint  Patient presents with   Chest Pain    HPI Matthew Hatfield is a 57 y.o. male.   Patient here today for evaluation of chest discomfort that started this morning.  He states that he feels somewhat like indigestion and he will belch and will have improvement but then symptoms returned.  He denies any lightheadedness or shortness of breath.  He has had some mild nausea.  The history is provided by the patient.  Chest Pain Associated symptoms: nausea   Associated symptoms: no fever, no numbness, no shortness of breath and no vomiting     Past Medical History:  Diagnosis Date   Cancer of base of tongue (HCC)    GERD (gastroesophageal reflux disease)    Hyperlipidemia    Hypertension    Personal history of testicular cancer    Throat cancer [facility])     Patient Active Problem List   Diagnosis Date Noted   Lumbar facet joint syndrome 01/13/2022   Bilateral lower extremity edema 10/27/2020   Spondylolisthesis, lumbar region 07/08/2020   Malignant neoplasm of base of tongue (HCC) 06/24/2020   Iron deficiency anemia 02/04/2020   Cough 03/20/2017   Cough with hemoptysis 03/20/2017   Gastroesophageal reflux disease 03/20/2017   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 Authorizing Provider  amLODipine (NORVASC) 10 MG tablet Take 1 tablet (10 mg total) by mouth daily. 05/26/22  Yes Georganna Skeans, MD   aspirin 81 MG chewable tablet Chew 81 mg by mouth.   Yes [provider]  hydrochlorothiazide (HYDRODIURIL) 25 MG tablet Take 1 tablet (25 mg total) by mouth daily. 05/26/22  Yes Georganna Skeans, MD  lisinopril (ZESTRIL) 10 MG tablet Take 1 tablet (10 mg total) by mouth daily. 05/26/22  Yes Georganna Skeans, MD  PARoxetine (PAXIL) 30 MG tablet Take 1 tablet (30 mg total) by mouth daily. 11/30/21  Yes Georganna Skeans, MD  atorvastatin (LIPITOR) 40 MG tablet Take 1 tablet (40 mg total) by mouth daily. 11/18/21 02/16/22  Georganna Skeans, MD  docusate sodium (COLACE) 100 MG capsule Take 100 mg by mouth 3 (three) times daily. 01/03/21   Melvyn Neth, Dequincy A, MD  doxycycline (VIBRAMYCIN) 100 MG capsule Take 1 capsule (100 mg total) by mouth 2 (two) times daily. 10/25/21   Tomi Bamberger, PA-C  ferrous sulfate 325 (65 FE) MG EC tablet Take 325 mg by mouth 3 (three) times daily with meals. 01/03/21   Melvyn Neth, Dequincy A, MD  hydrOXYzine (ATARAX) 25 MG tablet Take 1 tablet (25 mg total) by mouth every 6 (six) hours. 11/30/21   Georganna Skeans, MD  meloxicam [facility]) 15 MG tablet take 1 tablet by oral route  every day with food 07/07/21   [provider]  PARoxetine (PAXIL) 20 MG tablet Take 1 tablet (20 mg total) by mouth  daily. 01/31/21   Georganna Skeans, MD    Family History Family History  Problem Relation Age of Onset   Cancer Mother    Heart disease Mother    Lung cancer Father    Diabetes Maternal Uncle    Colon polyps Neg Hx    Colon cancer Neg Hx    Esophageal cancer Neg Hx    Stomach cancer Neg Hx    Rectal cancer Neg Hx     Social History Social History   Tobacco Use   Smoking status: Never   Smokeless tobacco: Never  Vaping Use   Vaping status: Never Used  Substance Use Topics   Alcohol use: No   Drug use: No     Allergies   Other; Lidocaine; Pollen extract; Shellfish-derived products; Venofer [iron sucrose]; Antihistamines, diphenhydramine-type; and Feraheme  [ferumoxytol]   Review of Systems Review of Systems  Constitutional:  Negative for chills and fever.  Eyes:  Negative for discharge and redness.  Respiratory:  Negative for shortness of breath.   Cardiovascular:  Positive for chest pain.  Gastrointestinal:  Positive for nausea. Negative for vomiting.  Neurological:  Negative for light-headedness and numbness.     Physical Exam Triage Vital Signs ED Triage Vitals [09/20/22 1428]  Encounter Vitals Group     BP 135/79     Systolic BP Percentile      Diastolic BP Percentile      Pulse Rate 75     Resp 18     Temp 98.9 F (37.2 C)     Temp Source Oral     SpO2 97 %     Weight      Height      Head Circumference      Peak Flow      Pain Score 1     Pain Loc      Pain Education      Exclude from Growth Chart    No data found.  Updated Vital Signs BP 135/79 (BP Location: Left Arm)   Pulse 75   Temp 98.9 F (37.2 C) (Oral)   Resp 18   SpO2 97%      Physical Exam Vitals and nursing note reviewed.  Constitutional:      General: He is not in acute distress.    Appearance: Normal appearance. He is not ill-appearing.  HENT:     Head: Normocephalic and atraumatic.  Eyes:     Conjunctiva/sclera: Conjunctivae normal.  Cardiovascular:     Rate and Rhythm: Normal rate and regular rhythm.  Pulmonary:     Effort: Pulmonary effort is normal. No respiratory distress.     Breath sounds: Normal breath sounds. No wheezing, rhonchi or rales.  Neurological:     Mental Status: He is alert.  Psychiatric:        Mood and Affect: Mood normal.        Behavior: Behavior normal.        Thought Content: Thought content normal.      UC Treatments / Results  Labs (all labs ordered are listed, but only abnormal results are displayed) Labs Reviewed - No data to display  EKG   Radiology No results found.  Procedures Procedures (including critical care time)  Medications Ordered in UC Medications - No data to  display  Initial Impression / Assessment and Plan / UC Course  I have reviewed the triage vital signs and the nursing notes.  Pertinent labs & imaging results that were available during  my care of the patient were reviewed by me and considered in my medical decision making (see chart for details).    EKG appears very similar to prior.  Overall reassuring however discussed that he would need stat troponins to rule out cardiac etiology.  Patient is agreeable to report to the emergency room and will do so via POV after leaving urgent care.  Final Clinical Impressions(s) / UC Diagnoses   Final diagnoses:  Chest discomfort   Discharge Instructions   None    ED Prescriptions   None    PDMP not reviewed this encounter.   Tomi Bamberger, PA-C 09/20/22 [phone]

## 2022-09-20 NOTE — ED Provider Notes (Signed)
[place] [facility] AT [facility] Provider Note   CSN: [id number] Arrival date & time: 09/20/22  1500     History  Chief Complaint  Patient presents with   Chest Pain    Matthew Hatfield is a 57 y.o. male.  HPI Patient reports that he got up at about 330 this morning to drive to [facility].  At about 6 AM he stopped at [facility] and got a small breakfast burrito.  He reports about an hour and a half or 2 later he started getting pain in his epigastrium.  He reports he felt like he had to burp and burping with slightly relieving but then some pain migrated from his right upper abdomen to his left upper abdomen and then to the center again and then up the chest.  He reports it did feel somewhat like pockets of gas moving.  However with the symptoms he turned back around and came home to get evaluated.  No prior history of coronary artery disease.  Patient was seen in urgent care and EKG was unchanged from previous but recommended to get labs.  Patient reports has had chronic anemia.  This has been very longstanding.  He sees hematology regularly.  It usually responds to iron supplementation.  Patient denies any blood loss.  He reports has had his stool checked many times and it never has blood in it.  He reports he has not seen anything that look like bloody or dark stool.  He has had no emesis with blood.  Reports his urine appears normal.  No signs or symptoms that he would consider suspicious for blood loss.  Reports this anemia is typical and he feels well at baseline.    Home Medications Prior to Admission medications   Medication Sig Start Date End Date Taking? Authorizing Provider  omeprazole (PRILOSEC) 20 MG capsule Take 1 capsule (20 mg total) by mouth daily. 09/20/22  Yes Arby Barrette, MD  amLODipine (NORVASC) 10 MG tablet Take 1 tablet (10 mg total) by mouth daily. 05/26/22   Georganna Skeans, MD  aspirin 81 MG chewable tablet Chew 81 mg by mouth.     [provider]  atorvastatin (LIPITOR) 40 MG tablet Take 1 tablet (40 mg total) by mouth daily. 11/18/21 02/16/22  Georganna Skeans, MD  docusate sodium (COLACE) 100 MG capsule Take 100 mg by mouth 3 (three) times daily. 01/03/21   Melvyn Neth, Dequincy A, MD  doxycycline (VIBRAMYCIN) 100 MG capsule Take 1 capsule (100 mg total) by mouth 2 (two) times daily. 10/25/21   Tomi Bamberger, PA-C  ferrous sulfate 325 (65 FE) MG EC tablet Take 325 mg by mouth 3 (three) times daily with meals. 01/03/21   Lewis, Dequincy A, MD  hydrochlorothiazide (HYDRODIURIL) 25 MG tablet Take 1 tablet (25 mg total) by mouth daily. 05/26/22   Georganna Skeans, MD  hydrOXYzine (ATARAX) 25 MG tablet Take 1 tablet (25 mg total) by mouth every 6 (six) hours. 11/30/21   Georganna Skeans, MD  lisinopril (ZESTRIL) 10 MG tablet Take 1 tablet (10 mg total) by mouth daily. 05/26/22   Georganna Skeans, MD  meloxicam [facility]) 15 MG tablet take 1 tablet by oral route  every day with food 07/07/21   [provider]  PARoxetine (PAXIL) 20 MG tablet Take 1 tablet (20 mg total) by mouth daily. 01/31/21   Georganna Skeans, MD  PARoxetine (PAXIL) 30 MG tablet Take 1 tablet (30 mg total) by mouth daily. 11/30/21  Georganna Skeans, MD      Allergies    Other; Lidocaine; Pollen extract; Shellfish-derived products; Venofer [iron sucrose]; Antihistamines, diphenhydramine-type; and Feraheme [ferumoxytol]    Review of Systems   Review of Systems  Physical Exam Updated Vital Signs BP 132/78 (BP Location: Left Arm)   Pulse 74   Temp 98.3 F (36.8 C) (Oral)   Resp 16   SpO2 98%  Physical Exam Constitutional:      Appearance: Normal appearance.  HENT:     Mouth/Throat:     Pharynx: Oropharynx is clear.  Eyes:     Extraocular Movements: Extraocular movements intact.  Cardiovascular:     Rate and Rhythm: Normal rate and regular rhythm.  Pulmonary:     Effort: Pulmonary effort is normal.     Breath sounds: Normal breath sounds.   Abdominal:     General: There is no distension.     Palpations: Abdomen is soft.     Tenderness: There is no abdominal tenderness. There is no guarding.  Musculoskeletal:        General: No swelling or tenderness. Normal range of motion.     Right lower leg: No edema.     Left lower leg: No edema.  Skin:    General: Skin is warm and dry.  Neurological:     General: No focal deficit present.     Mental Status: He is alert and oriented to person, place, and time.     Coordination: Coordination normal.  Psychiatric:        Mood and Affect: Mood normal.     ED Results / Procedures / Treatments   Labs (all labs ordered are listed, but only abnormal results are displayed) Labs Reviewed  BASIC METABOLIC PANEL - Abnormal; Notable for the following components:      Result Value   Potassium 3.3 (*)    All other components within normal limits  CBC - Abnormal; Notable for the following components:   RBC 3.95 (*)    Hemoglobin 7.7 (*)    HCT 27.0 (*)    MCV 68.4 (*)    MCH 19.5 (*)    MCHC 28.5 (*)    RDW 22.9 (*)    Platelets 410 (*)    All other components within normal limits  TROPONIN I (HIGH SENSITIVITY)  TROPONIN I (HIGH SENSITIVITY)    EKG EKG Interpretation Date/Time:  Wednesday September 20 2022 15:36:52 EDT Ventricular Rate:  71 PR Interval:  165 QRS Duration:  101 QT Interval:  394 QTC Calculation: 429 R Axis:   2  Text Interpretation: Sinus rhythm Nonspecific T abnormalities, inferior leads agree, no acute ischemic appearance Confirmed by Arby Barrette [phone]) on 09/20/2022 5:10:59 PM  Radiology DG Chest 2 View  Result Date: 09/20/2022 CLINICAL DATA:  Chest pain after eating this morning. EXAM: CHEST - 2 VIEW COMPARISON:  Radiographs 02/24/2021 and 09/25/2020. FINDINGS: The heart size and mediastinal contours are normal. The lungs are clear. There is no pleural effusion or pneumothorax. No acute osseous findings are identified. Telemetry leads overlie the chest.  IMPRESSION: No active cardiopulmonary process. Electronically Signed   By: Carey Bullocks M.D.   On: 09/20/2022 16:12    Procedures Procedures    Medications Ordered in ED Medications - No data to display  ED Course/ Medical Decision Making/ A&P  Medical Decision Making Amount and/or Complexity of Data Reviewed Labs: ordered. Radiology: ordered.   Patient presents as outlined.  He is clinically well in appearance today.  Vital signs are normal.  Patient had an episode of chest pain that is suggestive of reflux or gas.  He did get relief with belching but he got recurrent several times.  Pain has been completely gone now since earlier today.  No associated symptoms to suggest ACS.  Patient does not have a smoking history and no known ACS.  Limited risk factors.  Troponin 4. Chest x-ray interpreted by radiology no acute findings Potassium 3.3.  Normal GFR.  White count 10.4 hemoglobin 7.7 platelets 410.  Patient reports chronic anemia, longstanding.  Review of EMR is consistent with this.  Patient reports history of taking iron and no history of GI blood loss.  The patient does not have any symptomatic anemia.  No signs of demand ischemia.  Episode occurred at rest and has a highly GI quality to it.  At this time with troponin normal and EKG unchanged with low risk sounding chest pain, will plan for continued outpatient follow-up.  Patient reports he does have scheduled follow-up with his PCP soon.  He will discuss cardiac stress test although we discussed the fact that he will likely need his hemoglobin corrected first.  He voices understanding and reports he sees hematology on a regular basis.  He will schedule his follow-ups.  Based on description sounding very suggestive of reflux, I have suggested a trial of omeprazole for 1 to 2 weeks.  This is sent by prescription.  Also included information for reflux.  We reviewed return precautions and patient  advises he will return immediately should he have any concerning symptoms such as recurrence of chest pain, shortness of breath, lightheadedness or other concerning changes.        Final Clinical Impression(s) / ED Diagnoses Final diagnoses:  Atypical chest pain  Chronic anemia    Rx / DC Orders ED Discharge Orders          Ordered    omeprazole (PRILOSEC) 20 MG capsule  Daily        09/20/22 1802              Arby Barrette, MD 09/20/22 1811

## 2022-09-20 NOTE — ED Notes (Signed)
Patient is being discharged from the Urgent Care and sent to the Emergency Department via Private vehicle (self) . Per Provider Guy Sandifer), patient is in need of higher level of care due to Complexity (Chest Pain). Patient is aware and verbalizes understanding of plan of care.  Vitals:   09/20/22 1428  BP: 135/79  Pulse: 75  Resp: 18  Temp: 98.9 F (37.2 C)  SpO2: 97%

## 2022-09-20 NOTE — Discharge Instructions (Signed)
1.  Follow-up with your family doctor to discuss a stress test.  Return to the emergency department immediately if you get recurrence of chest pain with shortness of breath, nausea, lightheadedness or any other concerning symptoms. 2.  You have chronic anemia, your blood count is low right now it 7.7.  See your hematologist as soon as possible for recheck.  They would likely need to address this before you can do a stress test. 3.  Your described symptoms sound suspicious for an episode of reflux.  Try omeprazole for the next 2 weeks.  Try dietary restrictions as well.

## 2022-09-20 NOTE — ED Triage Notes (Signed)
Pt presents with chest pain that started this morning. Pt states he was having some indigestion and pain in center and down to ribs that comes and goes. No chest pain at this moment, last time he felt the pain was 20 minuets ago.

## 2022-09-25 DIAGNOSIS — D2371 Other benign neoplasm of skin of right lower limb, including hip: Secondary | ICD-10-CM | POA: Diagnosis not present

## 2022-09-25 DIAGNOSIS — D485 Neoplasm of uncertain behavior of skin: Secondary | ICD-10-CM | POA: Diagnosis not present

## 2022-11-06 ENCOUNTER — Encounter: Payer: Self-pay | Admitting: Physician Assistant

## 2022-11-06 ENCOUNTER — Ambulatory Visit: Payer: BC Managed Care – PPO | Admitting: Physician Assistant

## 2022-11-06 ENCOUNTER — Ambulatory Visit: Payer: Self-pay

## 2022-11-06 VITALS — BP 124/69 | HR 82 | Ht 70.0 in | Wt 220.0 lb

## 2022-11-06 DIAGNOSIS — R42 Dizziness and giddiness: Secondary | ICD-10-CM | POA: Diagnosis not present

## 2022-11-06 DIAGNOSIS — R0789 Other chest pain: Secondary | ICD-10-CM | POA: Diagnosis not present

## 2022-11-06 DIAGNOSIS — E876 Hypokalemia: Secondary | ICD-10-CM | POA: Diagnosis not present

## 2022-11-06 DIAGNOSIS — Z09 Encounter for follow-up examination after completed treatment for conditions other than malignant neoplasm: Secondary | ICD-10-CM

## 2022-11-06 DIAGNOSIS — D508 Other iron deficiency anemias: Secondary | ICD-10-CM | POA: Diagnosis not present

## 2022-11-06 NOTE — Progress Notes (Signed)
Patient ID: Matthew Hatfield, male   DOB: [birth year], 57 y.o.   MRN: [id number]   Matthew Hatfield, is a 57 y.o. male  ATF:[id number]  YHC:[id number]  DOB - [birth year]  Chief Complaint  Patient presents with   Dizziness    Patient states he woke up feeling dizzy, episode lasted 15 minutes. Once it passed he was able to get fluids, bp 107/78- took bp medication at 12am        Subjective:   Matthew Hatfield is a 57 y.o. male here today for dizziness.  He was seen in the ED 09/20/2022 with CP.  They believed this was related to acid reflux.  He does have multiple risk factors.    Today when he sat up in bed, he felt dizzy and like the room was spinning.  He denies any CP/SOB.  No N/V.  No changes in BM.  He takes his iron daily(has long-standing anemia-last infusion was 07/2022.  Most recent Hgb 7.7 on 09/20/2022.  No recent illness.  The dizziness last for about 15 mins and waxed and waned in intensity.  Then the dizziness went a way and now he feels fine. Non smoker  No problems updated.  ALLERGIES: Allergies  Allergen Reactions   Other Hives and Other (See Comments)    Cat and dog dander  Racing heart   Lidocaine Swelling    Pt has used magic mouthwash and viscous lidocaine with oral swelling   Pollen Extract Other (See Comments)    .   Shellfish-Derived Products Hives   Venofer [Iron Sucrose] Hives    Pt reported that a few hours after his 1st dose of venofer, he developed itching and hives around neck and head.   Antihistamines, Diphenhydramine-Type Palpitations   Feraheme [Ferumoxytol] Hives, Itching and Rash    Pt had redness of upper back and arms with itching.  Also had raised hives on upper arm.  Treated with pepcid 20 mg and diphenhydramine 12.5mg .    PAST MEDICAL HISTORY: Past Medical History:  Diagnosis Date   Cancer of base of tongue (HCC)    GERD (gastroesophageal reflux disease)    Hyperlipidemia    Hypertension    Personal history of testicular cancer    Throat  cancer (HCC)     MEDICATIONS AT HOME: Prior to Admission medications   Medication Sig Start Date End Date Taking? Authorizing Provider  amLODipine (NORVASC) 10 MG tablet Take 1 tablet (10 mg total) by mouth daily. 05/26/22  Yes Georganna Skeans, MD  aspirin 81 MG chewable tablet Chew 81 mg by mouth.   Yes [provider]  hydrochlorothiazide (HYDRODIURIL) 25 MG tablet Take 1 tablet (25 mg total) by mouth daily. 05/26/22  Yes Georganna Skeans, MD  lisinopril (ZESTRIL) 10 MG tablet Take 1 tablet (10 mg total) by mouth daily. 05/26/22  Yes Georganna Skeans, MD  omeprazole (PRILOSEC) 20 MG capsule Take 1 capsule (20 mg total) by mouth daily. 09/20/22  Yes Arby Barrette, MD  PARoxetine (PAXIL) 30 MG tablet Take 1 tablet (30 mg total) by mouth daily. 11/30/21  Yes Georganna Skeans, MD  atorvastatin (LIPITOR) 40 MG tablet Take 1 tablet (40 mg total) by mouth daily. 11/18/21 02/16/22  Georganna Skeans, MD  docusate sodium (COLACE) 100 MG capsule Take 100 mg by mouth 3 (three) times daily. 01/03/21   Melvyn Neth, Dequincy A, MD  doxycycline (VIBRAMYCIN) 100 MG capsule Take 1 capsule (100 mg total) by mouth 2 (two) times daily. 10/25/21   Tomi Bamberger, PA-C  ferrous sulfate 325 (65 FE) MG EC tablet Take 325 mg by mouth 3 (three) times daily with meals. 01/03/21   Melvyn Neth, Dequincy A, MD  hydrOXYzine (ATARAX) 25 MG tablet Take 1 tablet (25 mg total) by mouth every 6 (six) hours. 11/30/21   Georganna Skeans, MD  meloxicam [facility]) 15 MG tablet take 1 tablet by oral route  every day with food 07/07/21   [provider]  PARoxetine (PAXIL) 20 MG tablet Take 1 tablet (20 mg total) by mouth daily. 01/31/21   Georganna Skeans, MD    ROS: Neg HEENT Neg resp Neg cardiac Neg GI Neg GU Neg MS Neg psych Neg neuro  Objective:   Vitals:   11/06/22 1205  BP: 124/69  Pulse: 82  SpO2: 99%  Weight: 220 lb (99.8 kg)  Height: 5\' 10"  (1.778 m)   Exam General appearance : Awake, alert, not in any distress. Speech  Clear. Not toxic looking HEENT: Atraumatic and Normocephalic.  BTM WNL, throat with PND Neck: Supple, no JVD. No cervical lymphadenopathy. Some firmness in L side of neck from previous scar with throat CA(no new lump or LN) Chest: Good air entry bilaterally, CTAB.  No rales/rhonchi/wheezing CVS: S1 S2 regular, no murmurs.  Extremities: B/L Lower Ext shows no edema, both legs are warm to touch Neurology: Awake alert, and oriented X 3, CN II-XII intact, Non focal Skin: No Rash, he does appear pale  Data Review Lab Results  Component Value Date   HGBA1C 5.8 (H) 02/24/2019    Assessment & Plan   1. Dizziness and giddiness EKG is normal today.  He is at risk for low hemoglobin esp.  I have encouraged him to go to the ED/call 911 if he develops Cp/SOB/dizziness recurs.  Her verbalizes understanding - Basic Metabolic Panel - EKG 12-Lead - Ambulatory referral to Cardiology  2. Hypokalemia At ED previously - Basic Metabolic Panel  3. Other iron deficiency anemia Last iron infusion in June-likely needs iron.  Increase water intake - CBC with Differential - Iron, TIBC and Ferritin Panel  4. Other chest pain Resolved but still warrants cardiac work-up due to risk factors - Ambulatory referral to Cardiology  5. Encounter for examination following treatment at hospital   Return for keep appt with your PCP in October.  The patient was given clear instructions to go to ER or return to medical center if symptoms don't improve, worsen or new problems develop. The patient verbalized understanding. The patient was told to call to get lab results if they haven't heard anything in the next week.      [name], PA-C [facility] and [facility] [place], [place] [phone]   11/06/2022, 12:42 PM

## 2022-11-06 NOTE — Telephone Encounter (Signed)
Summary: Dizziness Advice   Pt is calling to report dizziness this morning. Pt reports that his BP is 107/78. Please advise          Chief Complaint: Dizziness today, BP 107/78. Now it is 132/78. Able to walk around, "it's better now." Symptoms: Above Frequency: Today Pertinent Negatives: Patient denies chest pain Disposition: [] ED /[] Urgent Care (no appt availability in office) / [] Appointment(In office/virtual)/ []  [facility]/ [] Home Care/ [] Refused Recommended Disposition /[x] [place]/ []  Follow-up with PCP Additional Notes: Pt. Agrees with mobile unit.  Reason for Disposition  [1] MODERATE dizziness (e.g., interferes with normal activities) AND [2] has NOT been evaluated by doctor (or NP/PA) for this  (Exception: Dizziness caused by heat exposure, sudden standing, or poor fluid intake.)  Answer Assessment - Initial Assessment Questions 1. DESCRIPTION: "Describe your dizziness."     Dizzy 2. LIGHTHEADED: "Do you feel lightheaded?" (e.g., somewhat faint, woozy, weak upon standing)     Woozy 3. VERTIGO: "Do you feel like either you or the room is spinning or tilting?" (i.e. vertigo)     No 4. SEVERITY: "How bad is it?"  "Do you feel like you are going to faint?" "Can you stand and walk?"   - MILD: Feels slightly dizzy, but walking normally.   - MODERATE: Feels unsteady when walking, but not falling; interferes with normal activities (e.g., school, work).   - SEVERE: Unable to walk without falling, or requires assistance to walk without falling; feels like passing out now.      Mild 5. ONSET:  "When did the dizziness begin?"     Today 6. AGGRAVATING FACTORS: "Does anything make it worse?" (e.g., standing, change in head position)     Wa 7. HEART RATE: "Can you tell me your heart rate?" "How many beats in 15 seconds?"  (Note: not all patients can do this)       Pulse 85  BP 132/78 8. CAUSE: "What do you think is causing the dizziness?"     Unsure 9.  RECURRENT SYMPTOM: "Have you had dizziness before?" If Yes, ask: "When was the last time?" "What happened that time?"     No 10. OTHER SYMPTOMS: "Do you have any other symptoms?" (e.g., fever, chest pain, vomiting, diarrhea, bleeding)       No 11. PREGNANCY: "Is there any chance you are pregnant?" "When was your last menstrual period?"       N/a  Protocols used: Dizziness - Lightheadedness-A-AH

## 2022-11-06 NOTE — Patient Instructions (Signed)
If hemoglobin is <7, please go the emergency department for blood transfusion.  If your Hgb is above 7, please call and schedule an appt with you hematologist

## 2022-11-07 ENCOUNTER — Ambulatory Visit: Payer: Self-pay | Admitting: *Deleted

## 2022-11-07 ENCOUNTER — Ambulatory Visit: Payer: BC Managed Care – PPO

## 2022-11-07 ENCOUNTER — Telehealth: Payer: Self-pay | Admitting: Family Medicine

## 2022-11-07 VITALS — BP 120/70 | HR 82 | Ht 70.0 in | Wt 219.8 lb

## 2022-11-07 DIAGNOSIS — R079 Chest pain, unspecified: Secondary | ICD-10-CM | POA: Insufficient documentation

## 2022-11-07 DIAGNOSIS — R42 Dizziness and giddiness: Secondary | ICD-10-CM | POA: Insufficient documentation

## 2022-11-07 DIAGNOSIS — D509 Iron deficiency anemia, unspecified: Secondary | ICD-10-CM | POA: Diagnosis not present

## 2022-11-07 DIAGNOSIS — I10 Essential (primary) hypertension: Secondary | ICD-10-CM

## 2022-11-07 HISTORY — DX: Chest pain, unspecified: R07.9

## 2022-11-07 HISTORY — DX: Dizziness and giddiness: R42

## 2022-11-07 LAB — CBC WITH DIFFERENTIAL/PLATELET
Basophils Absolute: 0.1 10*3/uL (ref 0.0–0.2)
Basos: 1 %
EOS (ABSOLUTE): 1.1 10*3/uL — ABNORMAL HIGH (ref 0.0–0.4)
Eos: 9 %
Hematocrit: 28.7 % — ABNORMAL LOW (ref 37.5–51.0)
Hemoglobin: 7.7 g/dL — ABNORMAL LOW (ref 13.0–17.7)
Immature Grans (Abs): 0 10*3/uL (ref 0.0–0.1)
Immature Granulocytes: 0 %
Lymphocytes Absolute: 1.1 10*3/uL (ref 0.7–3.1)
Lymphs: 9 %
MCH: 17.3 pg — ABNORMAL LOW (ref 26.6–33.0)
MCHC: 26.8 g/dL — ABNORMAL LOW (ref 31.5–35.7)
MCV: 65 fL — ABNORMAL LOW (ref 79–97)
Monocytes Absolute: 0.7 10*3/uL (ref 0.1–0.9)
Monocytes: 6 %
Neutrophils Absolute: 9.1 10*3/uL — ABNORMAL HIGH (ref 1.4–7.0)
Neutrophils: 75 %
Platelets: 580 10*3/uL — ABNORMAL HIGH (ref 150–450)
RBC: 4.45 x10E6/uL (ref 4.14–5.80)
RDW: 21.5 % — ABNORMAL HIGH (ref 11.6–15.4)
WBC: 12.2 10*3/uL — ABNORMAL HIGH (ref 3.4–10.8)

## 2022-11-07 LAB — IRON,TIBC AND FERRITIN PANEL
Ferritin: 20 ng/mL — ABNORMAL LOW (ref 30–400)
Iron Saturation: 5 % — CL (ref 15–55)
Iron: 21 ug/dL — ABNORMAL LOW (ref 38–169)
Total Iron Binding Capacity: 430 ug/dL (ref 250–450)
UIBC: 409 ug/dL — ABNORMAL HIGH (ref 111–343)

## 2022-11-07 LAB — BASIC METABOLIC PANEL
BUN/Creatinine Ratio: 16 (ref 9–20)
BUN: 13 mg/dL (ref 6–24)
CO2: 24 mmol/L (ref 20–29)
Calcium: 8.9 mg/dL (ref 8.7–10.2)
Chloride: 94 mmol/L — ABNORMAL LOW (ref 96–106)
Creatinine, Ser: 0.81 mg/dL (ref 0.76–1.27)
Glucose: 100 mg/dL — ABNORMAL HIGH (ref 70–99)
Potassium: 4.5 mmol/L (ref 3.5–5.2)
Sodium: 139 mmol/L (ref 134–144)
eGFR: 102 mL/min/{1.73_m2} (ref >59)

## 2022-11-07 NOTE — Telephone Encounter (Signed)
Pt called in about lab results. I let him know, not showing the results yet. He says someone was supposed to call him today he can knw if he has to go to the hospital or not.

## 2022-11-07 NOTE — Progress Notes (Signed)
Cardiology Consultation:    Date:  11/07/2022   ID:  Matthew Hatfield, DOB [birth year], MRN [id number]  PCP:  Matthew Skeans, MD  Cardiologist:  Matthew Corporal Brendaliz Kuk, MD Hematology/Oncologist: Dr Matthew Hatfield   Referring MD: Matthew Simmonds, PA-C   Dizziness   ASSESSMENT AND PLAN:   Mr Matthew Hatfield 56/M with history of testicular cancer in 2008 s/p orchiectomy and chemotherapy, squamous cell carcinoma of the base of the tongue in 2019 undergoing excision, chemoradiation, hypertension, hyperlipidemia, severe chronic iron deficiency anemia pending GI workup which has not been completed due to lack of social support for postprocedural safe discharge.  Now seen for dizziness.  Problem List Items Addressed This Visit     Essential hypertension    Well-controlled. Continue current medications amlodipine, hydrochlorothiazide, lisinopril. Target below 130 over 80 mmHg.       Iron deficiency anemia    Severe iron deficiency anemia, chronic with no reported obvious blood losses. He is still pending GI evaluation.  Stressed the importance of expediting GI evaluation. He is going to reach out to his PCP, and will have this expedited as possible.  If postprocedure discharge is an issue, he likely would need an inpatient observation bed overnight to facilitate getting the procedures done, he understands this and is agreeable.      Dizziness - Primary    1 episode of dizziness yesterday morning. But lasted for few minutes, cannot entirely attribute to postural changes.  Will assess with transthoracic echocardiogram for cardiac structure and function in the setting of longstanding history of hypertension. Also obtain a Zio patch for 30 days for extended monitoring.  Symptoms could be related to his underlying severe anemia although this has been stable over the past month.  Will have him come back for follow-up in 2 months after these testing results are completed.  He will need to have further  evaluation for his severe chronic iron deficiency anemia.      Relevant Orders   EKG 12-Lead (Completed)   ECHOCARDIOGRAM COMPLETE   LONG TERM MONITOR (3-14 DAYS)   Will arrange for follow-up in 2 months with her son to review echocardiogram and event monitor results. Advised him to keep himself well-hydrated. Advised him to go to the ER if he notices any worsening of symptoms or obvious blood in urine or stools or black-colored stools.    History of Present Illness:    Matthew Hatfield is a 57 y.o. male who is being seen today for the evaluation of dizziness at the request of Matthew Hatfield, [place].  He has no significant prior cardiac history. He has history of hypertension, hyperlipidemia.  Testicular cancer (Seminoma) in 2008 s/p orchiectomy and chemotherapy in 2008. 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  Chronic dysphagia since his oropharyngeal cancer, having to chew his food well before swallowing has reported.  Chronic anemia associated with severe iron deficiency for years.  Unfortunately has not had an EGD or colonoscopy as recommended over the years due to lack of social support to help him drive back from the appointments, tells me that procedures were canceled on the day they were scheduled because of this situation.  Has been following up with hematologist and has been on iron supplementation.  [Hemoglobin/hematocrit 7.7/27 on August 7 and 7.7/28.7 on September 23].  Yesterday morning at home after waking up he felt dizzy once he sat up.  Mentions the sensation lasted for few minutes.  Not associated  with any palpitations, fall, syncope.  No prior episodes.  No further episodes since then.  He had seen his PCP yesterday for further evaluation and referred to see [place] here for further evaluation from cardiology standpoint.  Month and a half ago on 09/20/2022 he had an ER visit for atypical chest discomfort that was felt to be associated  with acid reflux.  ER chart review noted troponins unremarkable at 4, nonspecific EKG findings.  His hemoglobin was noted to be 7.7, hematocrit 27, platelets 410.  He tells me he has not had any further dizziness or lightheadedness episodes since yesterday morning.  Denies any chest pain or shortness of breath or orthopnea. Denies any blood in urine or stools.  Mentions stools are somewhat darker given his iron supplementation but over the last couple days stools have been relatively brown.  He denies any abdominal pain or discomfort.  He continues to have to chew his food mindfully to avoid difficulty swallowing.  He denies any nausea or vomiting.  He is a non-smoker. Has quit alcohol many years ago. No recreational drug use.  Lives by himself at home with his pets, since his wife passed away 4 years ago. Patient's he does not have any family members or extended family members or friends to drive him back from scheduled GI procedures in the past and hence the procedure had to be canceled.  Past Medical History:  Diagnosis Date   Cancer of base of tongue (HCC)    GERD (gastroesophageal reflux disease)    Hyperlipidemia    Hypertension    Personal history of testicular cancer    Throat cancer (HCC)     Past Surgical History:  Procedure Laterality Date   CHOLECYSTECTOMY     COLONOSCOPY     HERNIA REPAIR     KNEE SURGERY     RADICAL ORCHIECTOMY Right    TONSILLECTOMY      Current Medications: Current Meds  Medication Sig   amLODipine (NORVASC) 10 MG tablet Take 1 tablet (10 mg total) by mouth daily.   aspirin 81 MG chewable tablet Chew 81 mg by mouth.   Flavoring Agent (VITAMIN/IRON MASKING AGENT) LIQD Take 10 mLs by mouth See admin instructions. 10ml bid 20 ml qd   hydrochlorothiazide (HYDRODIURIL) 25 MG tablet Take 1 tablet (25 mg total) by mouth daily.   lisinopril (ZESTRIL) 10 MG tablet Take 1 tablet (10 mg total) by mouth daily.   omeprazole (PRILOSEC) 20 MG capsule Take 1  capsule (20 mg total) by mouth daily. (Patient taking differently: Take 20 mg by mouth daily as needed (Acid reflux).)   PARoxetine (PAXIL) 20 MG tablet Take 20 mg by mouth as needed.   PARoxetine (PAXIL) 30 MG tablet Take 1 tablet (30 mg total) by mouth daily.     Allergies:   Other; Lidocaine; Pollen extract; Shellfish-derived products; Venofer [iron sucrose]; Antihistamines, diphenhydramine-type; and Feraheme [ferumoxytol]   Social History   Socioeconomic History   Marital status: Widowed    Spouse name: Not on file   Number of children: Not on file   Years of education: Not on file   Highest education level: Not on file  Occupational History   Not on file  Tobacco Use   Smoking status: Never   Smokeless tobacco: Never  Vaping Use   Vaping status: Never Used  Substance and Sexual Activity   Alcohol use: No   Drug use: No   Sexual activity: Not on file  Other Topics Concern  Not on file  Social History Narrative   Not on file   Social Determinants of Health   Financial Resource Strain: Low Risk  (06/12/2022)   Received from [facility], [facility]   Overall Financial Resource Strain (CARDIA)    Difficulty of Paying Living Expenses: Not hard at all  Food Insecurity: No Food Insecurity (06/12/2022)   Received from [facility], [facility]   Hunger Vital Sign    Worried About Running Out of Food in the Last Year: Never true    Ran Out of Food in the Last Year: Never true  Transportation Needs: No Transportation Needs (06/12/2022)   Received from [facility], [facility]   PRAPARE - Transportation    Lack of Transportation (Medical): No    Lack of Transportation (Non-Medical): No  Physical Activity: Unknown (06/12/2022)   Received from [facility], [facility]   Exercise Vital Sign    Days of Exercise per Week: 0 days    Minutes of Exercise per Session: Not on file  Recent Concern: Physical Activity - Insufficiently Active (05/26/2022)   Exercise Vital  Sign    Days of Exercise per Week: 4 days    Minutes of Exercise per Session: 30 min  Stress: No Stress Concern Present (06/23/2022)   Received from [facility], [facility] - Occupational Stress Questionnaire    Feeling of Stress : Not at all  Social Connections: Unknown (05/26/2022)   Social Connection and Isolation Panel [NHANES]    Frequency of Communication with Friends and Family: Three times a week    Frequency of Social Gatherings with Friends and Family: Three times a week    Attends Religious Services: More than 4 times per year    Active Member of Clubs or Organizations: No    Attends Banker Meetings: 1 to 4 times per year    Marital Status: Not on file     Family History: The patient's family history includes Cancer in his mother; Diabetes in his maternal uncle; Heart disease in his mother; Lung cancer in his father. There is no history of Colon polyps, Colon cancer, Esophageal cancer, Stomach cancer, or Rectal cancer. ROS:   Please see the history of present illness.    All 14 point review of systems negative except as described per history of present illness.  EKGs/Labs/Other Studies Reviewed:    The following studies were reviewed today:   EKG:  EKG Interpretation Date/Time:  Tuesday November 07 2022 10:59:47 EDT Ventricular Rate:  82 PR Interval:  154 QRS Duration:  98 QT Interval:  390 QTC Calculation: 455 R Axis:   -19  Text Interpretation: Normal sinus rhythm Normal ECG When compared with ECG of 20-Sep-2022 15:36, PREVIOUS ECG IS PRESENT Confirmed by Huntley Dec reddy [phone]) on 11/07/2022 11:06:27 AM    Recent Labs: 07/21/2022: ALT 13 11/06/2022: BUN 13; Creatinine, Ser 0.81; Hemoglobin 7.7; Platelets 580; Potassium 4.5; Sodium 139  Recent Lipid Panel    Component Value Date/Time   CHOL 188 12/23/2019 0908   TRIG 204 (H) 12/23/2019 0908   HDL 34 (L) 12/23/2019 0908   CHOLHDL 5.5 (H)  12/23/2019 0908   LDLCALC 118 (H) 12/23/2019 0908    Physical Exam:    VS:  BP 120/70 (BP Location: Left Arm, Patient Position: Sitting)   Pulse 82   Ht 5\' 10"  (1.778 m)   Wt 219 lb 12.8 oz (99.7 kg)   SpO2 97%  BMI 31.54 kg/m     Wt Readings from Last 3 Encounters:  11/07/22 219 lb 12.8 oz (99.7 kg)  11/06/22 220 lb (99.8 kg)  07/21/22 227 lb 9.6 oz (103.2 kg)     GENERAL:  Well nourished, well developed in no acute distress NECK: No JVD; No carotid bruits CARDIAC: RRR, S1 and S2 present, no murmurs, no rubs, no gallops CHEST:  Clear to auscultation without rales, wheezing or rhonchi  ABDOMEN: Soft, non-tender, non-distended Extremities: No pitting pedal edema. Pulses bilaterally symmetric with radial 2+ and dorsalis pedis 2+ NEUROLOGIC:  Alert and oriented x 3  Medication Adjustments/Labs and Tests Ordered: Current medicines are reviewed at length with the patient today.  Concerns regarding medicines are outlined above.  Orders Placed This Encounter  Procedures   LONG TERM MONITOR (3-14 DAYS)   EKG 12-Lead   ECHOCARDIOGRAM COMPLETE   No orders of the defined types were placed in this encounter.   Signed, Cecille Amsterdam, MD, MPH, [facility]. 11/07/2022 12:06 PM    [facility]

## 2022-11-07 NOTE — Patient Instructions (Signed)
Medication Instructions:  Your physician recommends that you continue on your current medications as directed. Please refer to the Current Medication list given to you today.  *If you need a refill on your cardiac medications before your next appointment, please call your pharmacy*   Lab Work: None Ordered If you have labs (blood work) drawn today and your tests are completely normal, you will receive your results only by: MyChart Message (if you have MyChart) OR A paper copy in the mail If you have any lab test that is abnormal or we need to change your treatment, we will call you to review the results.   Testing/Procedures: Your physician has requested that you have an echocardiogram. Echocardiography is a painless test that uses sound waves to create images of your heart. It provides your doctor with information about the size and shape of your heart and how well your heart's chambers and valves are working. This procedure takes approximately one hour. There are no restrictions for this procedure. Please do NOT wear cologne, perfume, aftershave, or lotions (deodorant is allowed). Please arrive 15 minutes prior to your appointment time.   WHY IS MY DOCTOR PRESCRIBING ZIO? The Zio system is proven and trusted by physicians to detect and diagnose irregular heart rhythms -- and has been prescribed to hundreds of thousands of patients.  The FDA has cleared the Zio system to monitor for many different kinds of irregular heart rhythms. In a study, physicians were able to reach a diagnosis 90% of the time with the Zio system1.  You can wear the Zio monitor -- a small, discreet, comfortable patch -- during your normal day-to-day activity, including while you sleep, shower, and exercise, while it records every single heartbeat for analysis.  1Barrett, P., et al. Comparison of 24 Hour Holter Monitoring Versus 14 Day Novel Adhesive Patch Electrocardiographic Monitoring. American Journal of Medicine,  2014.  ZIO VS. HOLTER MONITORING The Zio monitor can be comfortably worn for up to 14 days. Holter monitors can be worn for 24 to 48 hours, limiting the time to record any irregular heart rhythms you may have. Zio is able to capture data for the 51% of patients who have their first symptom-triggered arrhythmia after 48 hours.1  LIVE WITHOUT RESTRICTIONS The Zio ambulatory cardiac monitor is a small, unobtrusive, and water-resistant patch--you might even forget you're wearing it. The Zio monitor records and stores every beat of your heart, whether you're sleeping, working out, or showering.     Follow-Up: At [facility], you and your health needs are our priority.  As part of our continuing mission to provide you with exceptional heart care, we have created designated Provider Care Teams.  These Care Teams include your primary Cardiologist (physician) and Advanced Practice Providers (APPs -  Physician Assistants and Nurse Practitioners) who all work together to provide you with the care you need, when you need it.  We recommend signing up for the patient portal called "MyChart".  Sign up information is provided on this After Visit Summary.  MyChart is used to connect with patients for Virtual Visits (Telemedicine).  Patients are able to view lab/test results, encounter notes, upcoming appointments, etc.  Non-urgent messages can be sent to your provider as well.   To learn more about what you can do with MyChart, go to [email or web address].    Your next appointment:   3 month(s)  The format for your next appointment:   In Person  Provider:   Gypsy Balsam, MD  Other Instructions Follow up with PCP & GI for EGD and Colonoscopy

## 2022-11-07 NOTE — Assessment & Plan Note (Signed)
Severe iron deficiency anemia, chronic with no reported obvious blood losses. He is still pending GI evaluation.  Stressed the importance of expediting GI evaluation. He is going to reach out to his PCP, and will have this expedited as possible.  If postprocedure discharge is an issue, he likely would need an inpatient observation bed overnight to facilitate getting the procedures done, he understands this and is agreeable.

## 2022-11-07 NOTE — Assessment & Plan Note (Addendum)
1 episode of dizziness yesterday morning. But lasted for few minutes, cannot entirely attribute to postural changes.  Will assess with transthoracic echocardiogram for cardiac structure and function in the setting of longstanding history of hypertension. Also obtain a Zio patch for 30 days for extended monitoring.  Symptoms could be related to his underlying severe anemia although this has been stable over the past month.  Will have him come back for follow-up in 2 months after these testing results are completed.  He will need to have further evaluation for his severe chronic iron deficiency anemia.

## 2022-11-07 NOTE — Assessment & Plan Note (Signed)
Well-controlled. Continue current medications amlodipine, hydrochlorothiazide, lisinopril. Target below 130 over 80 mmHg.

## 2022-11-07 NOTE — Telephone Encounter (Signed)
Pt calling in for his lab results however there is not an interpretation from Dr. Andrey Campanile yet.

## 2022-11-08 ENCOUNTER — Ambulatory Visit: Payer: BC Managed Care – PPO

## 2022-11-08 DIAGNOSIS — R42 Dizziness and giddiness: Secondary | ICD-10-CM

## 2022-11-08 LAB — ECHOCARDIOGRAM COMPLETE
Calc EF: 46.5 %
S' Lateral: 3.5 cm
Single Plane A2C EF: 37.8 %
Single Plane A4C EF: 52.9 %

## 2022-11-08 NOTE — Telephone Encounter (Signed)
Mobile nurse reached out to patient

## 2022-11-09 ENCOUNTER — Encounter: Payer: Self-pay | Admitting: Family Medicine

## 2022-11-13 ENCOUNTER — Telehealth: Payer: Self-pay | Admitting: Oncology

## 2022-11-13 ENCOUNTER — Encounter: Payer: Self-pay | Admitting: Family Medicine

## 2022-11-13 NOTE — Progress Notes (Unsigned)
[facility]  [address] [place],  [place]  [address] [phone]  Clinic Day:  07/21/2022  Referring physician: Georganna Skeans, MD  HISTORY OF PRESENT ILLNESS:  The patient is a 57 y.o. male with severe iron deficiency anemia.  He comes in today to reassess his iron and hemoglobin after receiving a course of IV iron.  The patient claims to have tolerated IV Monferric very well.  He continues to deny having any overt forms of blood loss.  Of note, he takes liquid iron on a daily basis.  The patient has yet to undergo his GI workup.  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  He completed concurrent chemoradiation, which consisted of weekly carboplatin/paclitaxel, in May 2019.  All scans and physical exams since then have shown no signs of disease recurrence.  He denies having any new symptoms/findings which concern him for late disease recurrence.  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  Scans and exams since then never showed signs of disease recurrence.  He has been considered cured of this disease.    PHYSICAL EXAM:  There were no vitals taken for this visit. Wt Readings from Last 3 Encounters:  11/07/22 219 lb 12.8 oz (99.7 kg)  11/06/22 220 lb (99.8 kg)  07/21/22 227 lb 9.6 oz (103.2 kg)   There is no height or weight on file to calculate BMI. Performance status (ECOG): 0 Physical Exam Constitutional:      Appearance: Normal appearance. He is not ill-appearing.  HENT:     Mouth/Throat:     Mouth: Mucous membranes are moist.     Pharynx: Oropharynx is clear. No oropharyngeal exudate or posterior oropharyngeal erythema.  Cardiovascular:     Rate and Rhythm: Normal rate and regular rhythm.     Heart sounds: No murmur  heard.    No friction rub. No gallop.  Pulmonary:     Effort: Pulmonary effort is normal. No respiratory distress.     Breath sounds: Normal breath sounds. No wheezing, rhonchi or rales.  Abdominal:     General: Bowel sounds are normal. There is no distension.     Palpations: Abdomen is soft. There is no mass.     Tenderness: There is no abdominal tenderness.  Musculoskeletal:        General: No swelling.     Right lower leg: No edema.     Left lower leg: No edema.  Lymphadenopathy:     Cervical: No cervical adenopathy.     Upper Body:     Right upper body: No supraclavicular or axillary adenopathy.     Left upper body: No supraclavicular or axillary adenopathy.     Lower Body: No right inguinal adenopathy. No left inguinal adenopathy.  Skin:    General: Skin is warm.     Coloration: Skin is not jaundiced.     Findings: No lesion or rash.  Neurological:     General: No focal deficit present.     Mental Status: He is alert and oriented to person, place, and time. Mental status is at baseline.  Psychiatric:        Mood and Affect: Mood normal.        Behavior: Behavior normal.  Thought Content: Thought content normal.   LABS:  Latest Reference Range & Units 11/06/22 12:45  BASIC METABOLIC PANEL  Rpt !  Sodium 134 - 144 mmol/L 139  Potassium 3.5 - 5.2 mmol/L 4.5  Chloride 96 - 106 mmol/L 94 (L)  CO2 20 - 29 mmol/L 24  Glucose 70 - 99 mg/dL 161 (H)  BUN 6 - 24 mg/dL 13  Creatinine 0.96 - 0.45 mg/dL 4.09  Calcium 8.7 - 81.1 mg/dL 8.9  BUN/Creatinine Ratio 9 - 20  16  eGFR >59 mL/min/1.73 102  Iron 38 - 169 ug/dL 21 (L)  UIBC 914 - 782 ug/dL 956 (H)  TIBC 213 - 086 ug/dL 578  Ferritin 30 - 469 ng/mL 20 (L)  Iron Saturation 15 - 55 % 5 (LL)  WBC 3.4 - 10.8 x10E3/uL 12.2 (H)  RBC 4.14 - 5.80 x10E6/uL 4.45  Hemoglobin 13.0 - 17.7 g/dL 7.7 (L)  HCT 62.9 - 52.8 % 28.7 (L)  MCV 79 - 97 fL 65 (L)  MCH 26.6 - 33.0 pg 17.3 (L)  MCHC 31.5 - 35.7 g/dL 41.3 (L)  RDW 24.4 -  01.0 % 21.5 (H)  Platelets 150 - 450 x10E3/uL 580 (H)  Neutrophils Not Estab. % 75  Immature Granulocytes Not Estab. % 0  NEUT# 1.4 - 7.0 x10E3/uL 9.1 (H)  Lymphocyte # 0.7 - 3.1 x10E3/uL 1.1  Monocytes Absolute 0.1 - 0.9 x10E3/uL 0.7  Basophils Absolute 0.0 - 0.2 x10E3/uL 0.1  Immature Grans (Abs) 0.0 - 0.1 x10E3/uL 0.0  Lymphs Not Estab. % 9  Monocytes Not Estab. % 6  Basos Not Estab. % 1  Eos Not Estab. % 9  EOS (ABSOLUTE) 0.0 - 0.4 x10E3/uL 1.1 (H)  (LL): Data is critically low !: Data is abnormal (L): Data is abnormally low (H): Data is abnormally high Rpt: View report in Results Review for more information  ASSESSMENT & PLAN:  Assessment/Plan:  A 57 y.o. male with severe iron deficiency anemia.  Although this gentleman's hemoglobin is better, his hemoglobin remains very suboptimal.  Based upon this, I will arrange for him to receive another course of Monoferric iron next week, which is the only IV iron formulation he has tolerated without having any significant difficulties.  As mentioned previously, his GI evaluation is set for July 2024, which he knows to keep.  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  As he is now 5 years cancer free from this malignancy, I consider him cured of his base of tongue cancer.  I will see this patient back in 3 months to see how well he responded to another course of Monoferric IV iron.  The patient understands all the plans discussed today and is in agreement with them.    [place] Kirby Funk, MD

## 2022-11-13 NOTE — Telephone Encounter (Signed)
Patient has been scheduled. Aware of appt date and time.   LABS AND FOLLOW UP Received: Today Ruthell Rummage, CMA  P [facility] Scheduling PLEASE CALL PATIENT TO SCHEDULE LAB AND F/U APPT WITH LEWIS.  THANKS

## 2022-11-14 ENCOUNTER — Other Ambulatory Visit: Payer: Self-pay | Admitting: Oncology

## 2022-11-14 ENCOUNTER — Inpatient Hospital Stay: Payer: BC Managed Care – PPO | Attending: Oncology

## 2022-11-14 ENCOUNTER — Inpatient Hospital Stay (INDEPENDENT_AMBULATORY_CARE_PROVIDER_SITE_OTHER): Payer: BC Managed Care – PPO | Admitting: Oncology

## 2022-11-14 VITALS — BP 166/93 | HR 82 | Temp 98.9°F | Resp 18 | Ht 70.0 in | Wt 222.2 lb

## 2022-11-14 DIAGNOSIS — D509 Iron deficiency anemia, unspecified: Secondary | ICD-10-CM | POA: Diagnosis not present

## 2022-11-14 DIAGNOSIS — D508 Other iron deficiency anemias: Secondary | ICD-10-CM

## 2022-11-14 LAB — CBC AND DIFFERENTIAL
HCT: 26 — AB (ref 41–53)
Hemoglobin: 7.7 — AB (ref 13.5–17.5)
Neutrophils Absolute: 8.26
Platelets: 469 10*3/uL — AB (ref 150–400)
WBC: 11.8

## 2022-11-14 LAB — CBC: RBC: 4.54 (ref 3.87–5.11)

## 2022-11-15 ENCOUNTER — Telehealth: Payer: Self-pay | Admitting: Oncology

## 2022-11-15 ENCOUNTER — Encounter: Payer: Self-pay | Admitting: Oncology

## 2022-11-15 NOTE — Telephone Encounter (Signed)
11/15/22 Spoke with patient and confirmed all appts.

## 2022-11-15 NOTE — Progress Notes (Incomplete)
[facility]  [address] [place],  [place]  [address] [phone]  Clinic Day:  11/14/2022  Referring physician: Georganna Skeans, MD  HISTORY OF PRESENT ILLNESS:  The patient is a 57 y.o. male with severe iron deficiency anemia.  He comes in today to reassess his iron and hemoglobin levels.  He denies having any obvious forms of blood loss since his last visit.  Of note, the patient has yet to see GI for a repeat workup due to prior transportation limitations.    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  He completed concurrent chemoradiation, which consisted of weekly carboplatin/paclitaxel, in May 2019.  All scans and physical exams since then have shown no signs of disease recurrence.  He denies having any new symptoms/findings which concern him for late disease recurrence.  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  Scans and exams since then never showed signs of disease recurrence.  He has been considered cured of this disease.    PHYSICAL EXAM:  There were no vitals taken for this visit. Wt Readings from Last 3 Encounters:  11/07/22 219 lb 12.8 oz (99.7 kg)  11/06/22 220 lb (99.8 kg)  07/21/22 227 lb 9.6 oz (103.2 kg)   There is no height or weight on file to calculate BMI. Performance status (ECOG): 0 Physical Exam Constitutional:      Appearance: Normal appearance. He is not ill-appearing.  HENT:     Mouth/Throat:     Mouth: Mucous membranes are moist.     Pharynx: Oropharynx is clear. No oropharyngeal exudate or posterior oropharyngeal erythema.  Cardiovascular:     Rate and Rhythm: Normal rate and regular rhythm.     Heart sounds: No murmur heard.    No friction rub. No gallop.  Pulmonary:     Effort: Pulmonary effort is normal. No respiratory  distress.     Breath sounds: Normal breath sounds. No wheezing, rhonchi or rales.  Abdominal:     General: Bowel sounds are normal. There is no distension.     Palpations: Abdomen is soft. There is no mass.     Tenderness: There is no abdominal tenderness.  Musculoskeletal:        General: No swelling.     Right lower leg: No edema.     Left lower leg: No edema.  Lymphadenopathy:     Cervical: No cervical adenopathy.     Upper Body:     Right upper body: No supraclavicular or axillary adenopathy.     Left upper body: No supraclavicular or axillary adenopathy.     Lower Body: No right inguinal adenopathy. No left inguinal adenopathy.  Skin:    General: Skin is warm.     Coloration: Skin is not jaundiced.     Findings: No lesion or rash.  Neurological:     General: No focal deficit present.     Mental Status: He is alert and oriented to person, place, and time. Mental status is at baseline.  Psychiatric:        Mood and Affect: Mood normal.        Behavior: Behavior normal.        Thought Content: Thought content normal.   LABS:  Latest Reference Range & Units 11/06/22 12:45  BASIC METABOLIC PANEL  Rpt !  Sodium 134 - 144 mmol/L 139  Potassium 3.5 - 5.2 mmol/L 4.5  Chloride 96 - 106 mmol/L 94 (L)  CO2 20 - 29 mmol/L 24  Glucose 70 - 99 mg/dL 161 (H)  BUN 6 - 24 mg/dL 13  Creatinine 0.96 - 0.45 mg/dL 4.09  Calcium 8.7 - 81.1 mg/dL 8.9  BUN/Creatinine Ratio 9 - 20  16  eGFR >59 mL/min/1.73 102  Iron 38 - 169 ug/dL 21 (L)  UIBC 914 - 782 ug/dL 956 (H)  TIBC 213 - 086 ug/dL 578  Ferritin 30 - 469 ng/mL 20 (L)  Iron Saturation 15 - 55 % 5 (LL)  WBC 3.4 - 10.8 x10E3/uL 12.2 (H)  RBC 4.14 - 5.80 x10E6/uL 4.45  Hemoglobin 13.0 - 17.7 g/dL 7.7 (L)  HCT 62.9 - 52.8 % 28.7 (L)  MCV 79 - 97 fL 65 (L)  MCH 26.6 - 33.0 pg 17.3 (L)  MCHC 31.5 - 35.7 g/dL 41.3 (L)  RDW 24.4 - 01.0 % 21.5 (H)  Platelets 150 - 450 x10E3/uL 580 (H)  Neutrophils Not Estab. % 75  Immature  Granulocytes Not Estab. % 0  NEUT# 1.4 - 7.0 x10E3/uL 9.1 (H)  Lymphocyte # 0.7 - 3.1 x10E3/uL 1.1  Monocytes Absolute 0.1 - 0.9 x10E3/uL 0.7  Basophils Absolute 0.0 - 0.2 x10E3/uL 0.1  Immature Grans (Abs) 0.0 - 0.1 x10E3/uL 0.0  Lymphs Not Estab. % 9  Monocytes Not Estab. % 6  Basos Not Estab. % 1  Eos Not Estab. % 9  EOS (ABSOLUTE) 0.0 - 0.4 x10E3/uL 1.1 (H)  (LL): Data is critically low !: Data is abnormal (L): Data is abnormally low (H): Data is abnormally high Rpt: View report in Results Review for more information  ASSESSMENT & PLAN:  Assessment/Plan:  A 57 y.o. male with severe iron deficiency anemia.  Although this gentleman's hemoglobin is better, his hemoglobin remains very suboptimal.  Based upon this, I will arrange for him to receive another course of Monoferric iron next week, which is the only IV iron formulation he has tolerated without having any significant difficulties.  As mentioned previously, his GI evaluation is set for July 2024, which he knows to keep.  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  As he is now 5 years cancer free from this malignancy, I consider him cured of his base of tongue cancer.  I will see this patient back in 3 months to see how well he responded to another course of Monoferric IV iron.  The patient understands all the plans discussed today and is in agreement with them.    Antonyo Hinderer Kirby Funk, MD

## 2022-11-16 MED FILL — Ferric Derisomaltose (One Dose) IV Sol 1000 MG/10ML (Fe Eq): INTRAVENOUS | Qty: 10 | Status: AC

## 2022-11-17 ENCOUNTER — Inpatient Hospital Stay: Payer: BC Managed Care – PPO

## 2022-11-17 VITALS — BP 126/71 | HR 85 | Temp 98.4°F | Resp 18 | Ht 70.0 in | Wt 222.0 lb

## 2022-11-17 DIAGNOSIS — D509 Iron deficiency anemia, unspecified: Secondary | ICD-10-CM

## 2022-11-17 MED ORDER — SODIUM CHLORIDE 0.9 % IV SOLN
1000.0000 mg | Freq: Once | INTRAVENOUS | Status: AC
  Administered 2022-11-17: 1000 mg via INTRAVENOUS
  Filled 2022-11-17: qty 10

## 2022-11-17 MED ORDER — DIPHENHYDRAMINE HCL 50 MG/ML IJ SOLN
25.0000 mg | Freq: Once | INTRAMUSCULAR | Status: AC
  Administered 2022-11-17: 25 mg via INTRAVENOUS
  Filled 2022-11-17: qty 1

## 2022-11-17 MED ORDER — FAMOTIDINE IN NACL 20-0.9 MG/50ML-% IV SOLN
20.0000 mg | Freq: Once | INTRAVENOUS | Status: AC
  Administered 2022-11-17: 20 mg via INTRAVENOUS
  Filled 2022-11-17: qty 50

## 2022-11-17 MED ORDER — METHYLPREDNISOLONE SODIUM SUCC 125 MG IJ SOLR
125.0000 mg | Freq: Once | INTRAMUSCULAR | Status: AC
  Administered 2022-11-17: 125 mg via INTRAVENOUS
  Filled 2022-11-17: qty 2

## 2022-11-17 MED ORDER — SODIUM CHLORIDE 0.9 % IV SOLN: INTRAVENOUS | Status: DC

## 2022-11-17 NOTE — Patient Instructions (Signed)

## 2022-11-28 ENCOUNTER — Ambulatory Visit (INDEPENDENT_AMBULATORY_CARE_PROVIDER_SITE_OTHER): Payer: BC Managed Care – PPO | Admitting: Family Medicine

## 2022-11-28 ENCOUNTER — Encounter: Payer: Self-pay | Admitting: Family Medicine

## 2022-11-28 VITALS — BP 145/81 | HR 79 | Temp 98.4°F | Resp 18 | Ht 70.0 in | Wt 218.6 lb

## 2022-11-28 DIAGNOSIS — K219 Gastro-esophageal reflux disease without esophagitis: Secondary | ICD-10-CM

## 2022-11-28 DIAGNOSIS — D508 Other iron deficiency anemias: Secondary | ICD-10-CM

## 2022-11-28 DIAGNOSIS — F32A Depression, unspecified: Secondary | ICD-10-CM

## 2022-11-28 DIAGNOSIS — F419 Anxiety disorder, unspecified: Secondary | ICD-10-CM | POA: Diagnosis not present

## 2022-11-28 DIAGNOSIS — I10 Essential (primary) hypertension: Secondary | ICD-10-CM

## 2022-11-28 DIAGNOSIS — E785 Hyperlipidemia, unspecified: Secondary | ICD-10-CM | POA: Diagnosis not present

## 2022-11-28 DIAGNOSIS — M47817 Spondylosis without myelopathy or radiculopathy, lumbosacral region: Secondary | ICD-10-CM

## 2022-11-28 MED ORDER — LISINOPRIL 10 MG PO TABS: 10.0000 mg | ORAL_TABLET | Freq: Every day | ORAL | 1 refills | Status: AC

## 2022-11-28 MED ORDER — PAROXETINE HCL 30 MG PO TABS: 30.0000 mg | ORAL_TABLET | Freq: Every day | ORAL | 1 refills | Status: AC

## 2022-11-28 MED ORDER — HYDROCHLOROTHIAZIDE 25 MG PO TABS: 25.0000 mg | ORAL_TABLET | Freq: Every day | ORAL | 1 refills | Status: AC

## 2022-11-28 MED ORDER — AMLODIPINE BESYLATE 10 MG PO TABS: 10.0000 mg | ORAL_TABLET | Freq: Every day | ORAL | 1 refills | Status: AC

## 2022-11-28 NOTE — Progress Notes (Unsigned)
Established Patient Office Visit  Subjective    Patient ID: Matthew Hatfield, male    DOB: 03/06/1965  Age: 57 y.o. MRN: [id number]  CC:  Chief Complaint  Patient presents with   Follow-up    6 month follow up, lab work    HPI Matthew Hatfield presents for follow up of chronic med issues.   Outpatient Encounter Medications as of 11/28/2022  Medication Sig   amLODipine (NORVASC) 10 MG tablet Take 1 tablet (10 mg total) by mouth daily.   aspirin 81 MG chewable tablet Chew 81 mg by mouth.   Flavoring Agent (VITAMIN/IRON MASKING AGENT) LIQD Take 10 mLs by mouth See admin instructions. 10ml bid 20 ml qd   hydrochlorothiazide (HYDRODIURIL) 25 MG tablet Take 1 tablet (25 mg total) by mouth daily.   lisinopril (ZESTRIL) 10 MG tablet Take 1 tablet (10 mg total) by mouth daily.   omeprazole (PRILOSEC) 20 MG capsule Take 1 capsule (20 mg total) by mouth daily. (Patient taking differently: Take 20 mg by mouth daily as needed (Acid reflux).)   PARoxetine (PAXIL) 20 MG tablet Take 20 mg by mouth as needed.   PARoxetine (PAXIL) 30 MG tablet Take 1 tablet (30 mg total) by mouth daily.   No facility-administered encounter medications on file as of 11/28/2022.    Past Medical History:  Diagnosis Date   Cancer of base of tongue (HCC)    GERD (gastroesophageal reflux disease)    Hyperlipidemia    Hypertension    Personal history of testicular cancer    Throat cancer (HCC)     Past Surgical History:  Procedure Laterality Date   CHOLECYSTECTOMY     COLONOSCOPY     HERNIA REPAIR     KNEE SURGERY     RADICAL ORCHIECTOMY Right    TONSILLECTOMY      Family History  Problem Relation Age of Onset   Cancer Mother    Heart disease Mother    Lung cancer Father    Diabetes Maternal Uncle    Colon polyps Neg Hx    Colon cancer Neg Hx    Esophageal cancer Neg Hx    Stomach cancer Neg Hx    Rectal cancer Neg Hx     Social History   Socioeconomic History   Marital status: Widowed     Spouse name: Not on file   Number of children: Not on file   Years of education: Not on file   Highest education level: Not on file  Occupational History   Not on file  Tobacco Use   Smoking status: Never   Smokeless tobacco: Never  Vaping Use   Vaping status: Never Used  Substance and Sexual Activity   Alcohol use: No   Drug use: No   Sexual activity: Not on file  Other Topics Concern   Not on file  Social History Narrative   Not on file   Social Determinants of Health   Financial Resource Strain: Low Risk  (11/24/2022)   Overall Financial Resource Strain (CARDIA)    Difficulty of Paying Living Expenses: Not very hard  Food Insecurity: No Food Insecurity (11/24/2022)   Hunger Vital Sign    Worried About Running Out of Food in the Last Year: Never true    Ran Out of Food in the Last Year: Never true  Transportation Needs: No Transportation Needs (11/24/2022)   PRAPARE - Administrator, Civil Service (Medical): No    Lack of Transportation (Non-Medical): No  Physical Activity: Unknown (11/24/2022)   Exercise Vital Sign    Days of Exercise per Week: 1 day    Minutes of Exercise per Session: Patient declined  Stress: No Stress Concern Present (11/24/2022)   [employer] of Occupational Health - Occupational Stress Questionnaire    Feeling of Stress : Not at all  Social Connections: Unknown (11/24/2022)   Social Connection and Isolation Panel [NHANES]    Frequency of Communication with Friends and Family: Patient declined    Frequency of Social Gatherings with Friends and Family: Patient declined    Attends Religious Services: Never    Database administrator or Organizations: No    Attends Engineer, structural: 1 to 4 times per year    Marital Status: Widowed  Intimate Partner Violence: Not At Risk (06/12/2022)   Received from [facility]   HITS    Over the last 12 months how often did your partner physically hurt you?: 1     Over the last 12 months how often did your partner insult you or talk down to you?: 1    Over the last 12 months how often did your partner threaten you with physical harm?: 1    Over the last 12 months how often did your partner scream or curse at you?: 1    Review of Systems  All other systems reviewed and are negative.       Objective    BP (!) 153/82 (BP Location: Right Arm, Patient Position: Sitting, Cuff Size: Large)   Pulse 79   Temp 98.4 F (36.9 C) (Oral)   Resp 18   Ht 5\' 10"  (1.778 m)   Wt 218 lb 9.6 oz (99.2 kg)   SpO2 98%   BMI 31.37 kg/m   Physical Exam Vitals and nursing note reviewed.  Constitutional:      General: He is not in acute distress. Cardiovascular:     Rate and Rhythm: Normal rate and regular rhythm.  Pulmonary:     Effort: Pulmonary effort is normal.     Breath sounds: Normal breath sounds.  Abdominal:     Palpations: Abdomen is soft.     Tenderness: There is no abdominal tenderness.  Neurological:     General: No focal deficit present.     Mental Status: He is alert and oriented to person, place, and time.  Psychiatric:        Mood and Affect: Mood and affect normal.         Assessment & Plan:   1. Essential hypertension Slightly elevated readings.  - Lipid Panel - CMP14+EGFR - amLODipine (NORVASC) 10 MG tablet; Take 1 tablet (10 mg total) by mouth daily.  Dispense: 90 tablet; Refill: 1 - lisinopril (ZESTRIL) 10 MG tablet; Take 1 tablet (10 mg total) by mouth daily.  Dispense: 90 tablet; Refill: 1 - hydrochlorothiazide (HYDRODIURIL) 25 MG tablet; Take 1 tablet (25 mg total) by mouth daily.  Dispense: 90 tablet; Refill: 1  2. Anxiety and depression Will increase paxil from 20 mg to 30 mg daily  3. Dyslipidemia Continue   4. Other iron deficiency anemia ? 2/2 GI  5. Lumbosacral spondylosis without myelopathy Management as per consultant  6. Gastroesophageal reflux disease without esophagitis Keep scheduled follow ups  with GI    Return in about 6 months (around 05/29/2023) for follow up.   Tommie Raymond, MD

## 2022-11-29 ENCOUNTER — Encounter: Payer: Self-pay | Admitting: Family Medicine

## 2022-11-29 DIAGNOSIS — D649 Anemia, unspecified: Secondary | ICD-10-CM | POA: Diagnosis not present

## 2022-11-29 DIAGNOSIS — K219 Gastro-esophageal reflux disease without esophagitis: Secondary | ICD-10-CM | POA: Diagnosis not present

## 2022-11-29 DIAGNOSIS — K5900 Constipation, unspecified: Secondary | ICD-10-CM | POA: Diagnosis not present

## 2022-11-29 LAB — CMP14+EGFR
ALT: 16 [IU]/L (ref 0–44)
AST: 22 [IU]/L (ref 0–40)
Albumin: 4.1 g/dL (ref 3.8–4.9)
Alkaline Phosphatase: 161 [IU]/L — ABNORMAL HIGH (ref 44–121)
BUN/Creatinine Ratio: 20 (ref 9–20)
BUN: 14 mg/dL (ref 6–24)
Bilirubin Total: 0.2 mg/dL (ref 0.0–1.2)
CO2: 25 mmol/L (ref 20–29)
Calcium: 8.8 mg/dL (ref 8.7–10.2)
Chloride: 99 mmol/L (ref 96–106)
Creatinine, Ser: 0.71 mg/dL — ABNORMAL LOW (ref 0.76–1.27)
Globulin, Total: 2.4 g/dL (ref 1.5–4.5)
Glucose: 168 mg/dL — ABNORMAL HIGH (ref 70–99)
Potassium: 4.4 mmol/L (ref 3.5–5.2)
Sodium: 141 mmol/L (ref 134–144)
Total Protein: 6.5 g/dL (ref 6.0–8.5)
eGFR: 106 mL/min/{1.73_m2} (ref >59)

## 2022-11-29 LAB — LIPID PANEL
Chol/HDL Ratio: 5.6 {ratio} — ABNORMAL HIGH (ref 0.0–5.0)
Cholesterol, Total: 179 mg/dL (ref 100–199)
HDL: 32 mg/dL — ABNORMAL LOW (ref >39)
LDL Chol Calc (NIH): 123 mg/dL — ABNORMAL HIGH (ref 0–99)
Triglycerides: 135 mg/dL (ref 0–149)
VLDL Cholesterol Cal: 24 mg/dL (ref 5–40)

## 2022-11-29 NOTE — Telephone Encounter (Signed)
Patient was called and identified. Patient was given lab results and had no questions  

## 2022-11-30 DIAGNOSIS — D649 Anemia, unspecified: Secondary | ICD-10-CM | POA: Diagnosis not present

## 2022-11-30 DIAGNOSIS — D509 Iron deficiency anemia, unspecified: Secondary | ICD-10-CM | POA: Diagnosis not present

## 2022-12-24 DIAGNOSIS — M47816 Spondylosis without myelopathy or radiculopathy, lumbar region: Secondary | ICD-10-CM | POA: Diagnosis not present

## 2022-12-24 DIAGNOSIS — M545 Low back pain, unspecified: Secondary | ICD-10-CM | POA: Diagnosis not present

## 2023-01-05 ENCOUNTER — Encounter (HOSPITAL_BASED_OUTPATIENT_CLINIC_OR_DEPARTMENT_OTHER): Payer: Self-pay | Admitting: Emergency Medicine

## 2023-01-05 ENCOUNTER — Ambulatory Visit (HOSPITAL_BASED_OUTPATIENT_CLINIC_OR_DEPARTMENT_OTHER)
Admission: EM | Admit: 2023-01-05 | Discharge: 2023-01-05 | Disposition: A | Discharge status: Home / Self Care | Payer: BC Managed Care – PPO

## 2023-01-05 DIAGNOSIS — R1013 Epigastric pain: Secondary | ICD-10-CM | POA: Diagnosis not present

## 2023-01-05 DIAGNOSIS — K219 Gastro-esophageal reflux disease without esophagitis: Secondary | ICD-10-CM

## 2023-01-05 DIAGNOSIS — R112 Nausea with vomiting, unspecified: Secondary | ICD-10-CM

## 2023-01-05 MED ORDER — ONDANSETRON 4 MG PO TBDP: 4.0000 mg | ORAL_TABLET | Freq: Three times a day (TID) | ORAL | 0 refills | Status: DC | PRN

## 2023-01-05 MED ORDER — ONDANSETRON 4 MG PO TBDP
4.0000 mg | ORAL_TABLET | Freq: Once | ORAL | Status: AC
  Administered 2023-01-05: 4 mg via ORAL

## 2023-01-05 NOTE — ED Provider Notes (Signed)
[facility]    CSN: [id number] Arrival date & time: 01/05/23  1308      History   Chief Complaint Chief Complaint  Patient presents with   Abdominal Pain   Emesis    HPI Matthew Hatfield is a 57 y.o. male.   Matthew Hatfield is a 57 y.o. male presenting for chief complaint of Abdominal Pain and nausea/vomiting that started 4 days ago. History of GERD, states he has been belching more frequently and complains of epigastric abdominal discomfort that happens after eating. Describes abdominal pain as a "stomach flip and ache/upset" after eating. 4 days ago started having nausea and vomiting 1-2 hours after eating. Denies bloody/bilious emesis, diarrhea, and lower abdominal discomfort. Stools are regular, last bowel movement was this morning and normal. No recent fevers, chills, viral URI symptoms, dizziness, urinary symptoms, flank pain, headaches, or intake of foods outside of normal diet. Denies recent intake of known GERD triggers, NSAIDs, alcohol. Remote history of cholecystectomy. He was seen at another urgent care 2-3 days ago where he was felt to have GERD and started on omeprazole/given GERD behavioral precautions.  He remains with persistent nausea, though he has not vomited since yesterday and has been able to tolerate fluids without emesis. Denies recent use of antiemetic medications.  He has an appointment for upper and lower endoscopy on January 23, 2023 to investigate symptoms further.    Abdominal Pain Associated symptoms: vomiting   Emesis Associated symptoms: abdominal pain     Past Medical History:  Diagnosis Date   Cancer of base of tongue (HCC)    GERD (gastroesophageal reflux disease)    Hyperlipidemia    Hypertension    Personal history of testicular cancer    Throat cancer [facility])     Patient Active Problem List   Diagnosis Date Noted   Chest pain of uncertain etiology 11/07/2022   Dizziness 11/07/2022   Lumbar facet joint syndrome 01/13/2022    Bilateral lower extremity edema 10/27/2020   Spondylolisthesis, lumbar region 07/08/2020   Malignant neoplasm of base of tongue (HCC) 06/24/2020   Iron deficiency anemia 02/04/2020   Cough 03/20/2017   Cough with hemoptysis 03/20/2017   Gastroesophageal reflux disease 03/20/2017   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 Authorizing Provider  meloxicam (MOBIC) 7.5 MG tablet Take 7.5 mg by mouth 2 (two) times daily as needed. 12/27/22  Yes [provider]  methocarbamol (ROBAXIN) 750 MG tablet Take 750 mg by mouth 2 (two) times daily as needed. 12/25/22  Yes [provider]  ondansetron (ZOFRAN-ODT) 4 MG disintegrating tablet Take 1 tablet (4 mg total) by mouth every 8 (eight) hours as needed for nausea or vomiting. 01/05/23  Yes Carlisle Beers, FNP  amLODipine (NORVASC) 10 MG tablet Take 1 tablet (10 mg total) by mouth daily. 11/28/22   Georganna Skeans, MD  aspirin 81 MG chewable tablet Chew 81 mg by mouth.    [provider]  Flavoring Agent (  VITAMIN/IRON MASKING AGENT) LIQD Take 10 mLs by mouth See admin instructions. 10ml bid 20 ml qd    [provider]  hydrochlorothiazide (HYDRODIURIL) 25 MG tablet Take 1 tablet (25 mg total) by mouth daily. 11/28/22   Georganna Skeans, MD  lisinopril (ZESTRIL) 10 MG tablet Take 1 tablet (10 mg total) by mouth daily. 11/28/22   Georganna Skeans, MD  omeprazole (PRILOSEC) 20 MG capsule Take 1 capsule (20 mg total) by mouth daily. Patient taking differently:  Take 20 mg by mouth daily as needed (Acid reflux). 09/20/22   Arby Barrette, MD  PARoxetine (PAXIL) 30 MG tablet Take 1 tablet (30 mg total) by mouth daily. 11/28/22   Georganna Skeans, MD    Family History Family History  Problem Relation Age of Onset   Cancer Mother    Heart disease Mother    Lung cancer Father    Diabetes Maternal Uncle    Colon polyps Neg Hx    Colon cancer Neg Hx    Esophageal cancer Neg Hx    Stomach cancer Neg Hx    Rectal cancer Neg Hx     Social History Social History   Tobacco Use   Smoking status: Never   Smokeless tobacco: Never  Vaping Use   Vaping status: Never Used  Substance Use Topics   Alcohol use: No   Drug use: No     Allergies   Other; Lidocaine; Pollen extract; Shellfish-derived products; Venofer [iron sucrose]; Antihistamines, diphenhydramine-type; and Feraheme [ferumoxytol]   Review of Systems Review of Systems  Gastrointestinal:  Positive for abdominal pain and vomiting.  Per HPI   Physical Exam Triage Vital Signs ED Triage Vitals  Encounter Vitals Group     BP 01/05/23 0953 (!) 143/90     Systolic BP Percentile --      Diastolic BP Percentile --      Pulse Rate 01/05/23 0953 83     Resp 01/05/23 0953 17     Temp 01/05/23 0953 98.1 F (36.7 C)     Temp Source 01/05/23 0953 Oral     SpO2 01/05/23 0953 99 %     Weight --      Height --      Head Circumference --      Peak Flow --      Pain Score 01/05/23 0951 4     Pain Loc --      Pain Education --      Exclude from Growth Chart --    No data found.  Updated Vital Signs BP (!) 143/90 (BP Location: Right Arm)   Pulse 83   Temp 98.1 F (36.7 C) (Oral)   Resp 17   SpO2 99%   Visual Acuity Right Eye Distance:   Left Eye Distance:   Bilateral Distance:    Right Eye Near:   Left Eye Near:    Bilateral Near:     Physical Exam Vitals and nursing note reviewed.  Constitutional:      Appearance: He is not ill-appearing or toxic-appearing.  HENT:      Head: Normocephalic and atraumatic.     Right Ear: Hearing and external ear normal.     Left Ear: Hearing and external ear normal.     Nose: Nose normal.     Mouth/Throat:     Lips: Pink.     Mouth: Mucous membranes are moist. No injury.     Tongue: No lesions. Tongue does not deviate from midline.  Palate: No mass and lesions.     Pharynx: Oropharynx is clear. Uvula midline. No pharyngeal swelling, oropharyngeal exudate, posterior oropharyngeal erythema or uvula swelling.     Tonsils: No tonsillar exudate or tonsillar abscesses.  Eyes:     General: Lids are normal. Vision grossly intact. Gaze aligned appropriately.     Extraocular Movements: Extraocular movements intact.     Conjunctiva/sclera: Conjunctivae normal.  Cardiovascular:     Rate and Rhythm: Normal rate and regular rhythm.     Heart sounds: Normal heart sounds, S1 normal and S2 normal.  Pulmonary:     Effort: Pulmonary effort is normal. No respiratory distress.     Breath sounds: Normal breath sounds and air entry.  Abdominal:     General: Bowel sounds are normal.     Palpations: Abdomen is soft.     Tenderness: There is abdominal tenderness in the epigastric area and left upper quadrant. There is no right CVA tenderness, left CVA tenderness or guarding. Negative signs include Murphy's sign and McBurney's sign.  Musculoskeletal:     Cervical back: Neck supple.  Skin:    General: Skin is warm and dry.     Capillary Refill: Capillary refill takes less than 2 seconds.     Findings: No rash.  Neurological:     General: No focal deficit present.     Mental Status: He is alert and oriented to person, place, and time. Mental status is at baseline.     Cranial Nerves: No dysarthria or facial asymmetry.  Psychiatric:        Mood and Affect: Mood normal.        Speech: Speech normal.        Behavior: Behavior normal.        Thought Content: Thought content normal.        Judgment: Judgment normal.      UC Treatments  / Results  Labs (all labs ordered are listed, but only abnormal results are displayed) Labs Reviewed - No data to display  EKG   Radiology No results found.  Procedures Procedures (including critical care time)  Medications Ordered in UC Medications  ondansetron (ZOFRAN-ODT) disintegrating tablet 4 mg (4 mg Oral Given 01/05/23 1012)    Initial Impression / Assessment and Plan / UC Course  I have reviewed the triage vital signs and the nursing notes.  Pertinent labs & imaging results that were available during my care of the patient were reviewed by me and considered in my medical decision making (see chart for details).   1. Nausea and vomiting, GERD, epigastric abdominal pain Presentation suspicious for GERD flare.  Continue omeprazole with PRN use of tums etc for breakthrough symptoms. GERD behavioral and dietary changes discussed. Abdominal exam is without peritoneal signs, low suspicion for bowel obstruction/GI bleed. Zofran provided in clinic with some relief of nausea, may repeat this every 8 hours as needed at home for same.  Bland diet initially, then may increase as tolerated. PCP and GI follow-up encouraged.  Counseled patient on potential for adverse effects with medications prescribed/recommended today, strict ER and return-to-clinic precautions discussed, patient verbalized understanding.    Final Clinical Impressions(s) / UC Diagnoses   Final diagnoses:  Nausea and vomiting, unspecified vomiting type  Gastroesophageal reflux disease without esophagitis  Abdominal pain, epigastric     Discharge Instructions      Take prescribed medicines as directed. Medicine will help reduce the amount of acid your stomach makes and therefore improve your reflux symptoms  related to acid production.   Avoid spicy or acidic foods like tomatoes, chocolate, coffee, or acidic fruits like oranges as these can trigger symptoms.  I have included acid reflux education in your  packet for your review. Please also allow 2 hours after meals before lying flat to help prevent symptoms.   Take zofran as needed for nausea and vomiting.  Follow-up with PCP and/or GI doctor as scheduled.  If your symptoms do not improve in the next 5-7 days with interventions, please return. Go to the emergency room for severe symptoms of shortness of breath, worsening or uncontrolled abdominal or chest pain, headache, light headedness, feeling faint, nausea, vomiting, bloody vomit or stools, black tarry stools, or any other new/severe symptoms. I hope you feel better!     ED Prescriptions     Medication Sig Dispense Auth. Provider   ondansetron (ZOFRAN-ODT) 4 MG disintegrating tablet Take 1 tablet (4 mg total) by mouth every 8 (eight) hours as needed for nausea or vomiting. 20 tablet Carlisle Beers, FNP      PDMP not reviewed this encounter.   Carlisle Beers, FNP 01/05/23 1020

## 2023-01-05 NOTE — Discharge Instructions (Addendum)
Take prescribed medicines as directed. Medicine will help reduce the amount of acid your stomach makes and therefore improve your reflux symptoms related to acid production.   Avoid spicy or acidic foods like tomatoes, chocolate, coffee, or acidic fruits like oranges as these can trigger symptoms.  I have included acid reflux education in your packet for your review. Please also allow 2 hours after meals before lying flat to help prevent symptoms.   Take zofran as needed for nausea and vomiting.  Follow-up with PCP and/or GI doctor as scheduled.  If your symptoms do not improve in the next 5-7 days with interventions, please return. Go to the emergency room for severe symptoms of shortness of breath, worsening or uncontrolled abdominal or chest pain, headache, light headedness, feeling faint, nausea, vomiting, bloody vomit or stools, black tarry stools, or any other new/severe symptoms. I hope you feel better!

## 2023-01-05 NOTE — ED Triage Notes (Signed)
Pt reports had abd pains with nausea and vomiting for 4 days. Went to UC and was prescribed antacids that not helping.  Reports has history constipation and takes stool softeners due to cancer treatments.

## 2023-01-12 ENCOUNTER — Other Ambulatory Visit: Payer: Self-pay

## 2023-01-12 ENCOUNTER — Ambulatory Visit (HOSPITAL_BASED_OUTPATIENT_CLINIC_OR_DEPARTMENT_OTHER)
Admission: EM | Admit: 2023-01-12 | Discharge: 2023-01-12 | Disposition: A | Discharge status: Home / Self Care | Payer: BC Managed Care – PPO | Attending: Internal Medicine | Admitting: Internal Medicine

## 2023-01-12 ENCOUNTER — Encounter (HOSPITAL_BASED_OUTPATIENT_CLINIC_OR_DEPARTMENT_OTHER): Payer: Self-pay | Admitting: Emergency Medicine

## 2023-01-12 DIAGNOSIS — C259 Malignant neoplasm of pancreas, unspecified: Secondary | ICD-10-CM | POA: Diagnosis not present

## 2023-01-12 DIAGNOSIS — R109 Unspecified abdominal pain: Secondary | ICD-10-CM | POA: Diagnosis not present

## 2023-01-12 DIAGNOSIS — R17 Unspecified jaundice: Secondary | ICD-10-CM

## 2023-01-12 DIAGNOSIS — R1084 Generalized abdominal pain: Secondary | ICD-10-CM | POA: Diagnosis not present

## 2023-01-12 DIAGNOSIS — C787 Secondary malignant neoplasm of liver and intrahepatic bile duct: Secondary | ICD-10-CM | POA: Diagnosis not present

## 2023-01-12 NOTE — Discharge Instructions (Signed)
Go to the emergency department

## 2023-01-12 NOTE — ED Provider Notes (Signed)
[facility]    CSN: [id number] Arrival date & time: 01/12/23  1105      History   Chief Complaint No chief complaint on file.   HPI Matthew Hatfield is a 57 y.o. male.   HPI Abdominal pain, nausea, vomiting, inability to tolerate regular meals, constipation, straining.  States he is unable to eat regular meals can only have liquid meals or small servings due to abdominal pain, nausea and vomiting.  Has had constipation, last bowel movement was several days ago states it took 25 minutes of straining to move hard dry stool.  Denies rectal bleeding.  Admits abdominal pain mostly upper. patient states he was seen here recently for abdominal pain, was treated for GERD exacerbation.  Was treated with Zofran for his nausea, was already taking omeprazole for his GERD and Tums as needed.  He sees a gastroenterologist as scheduled for upper and lower GI studies.  Admits history of iron deficiency anemia, testicular cancer, tongue cancer.  Past Medical History:  Diagnosis Date   Cancer of base of tongue (HCC)    GERD (gastroesophageal reflux disease)    Hyperlipidemia    Hypertension    Personal history of testicular cancer    Throat cancer [facility])     Patient Active Problem List   Diagnosis Date Noted   Chest pain of uncertain etiology 11/07/2022   Dizziness 11/07/2022   Lumbar facet joint syndrome 01/13/2022   Bilateral lower extremity edema 10/27/2020   Spondylolisthesis, lumbar region 07/08/2020   Malignant neoplasm of base of tongue (HCC) 06/24/2020   Iron deficiency anemia 02/04/2020   Cough 03/20/2017   Cough with hemoptysis 03/20/2017   Gastroesophageal reflux disease 03/20/2017   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 Authorizing Provider  amLODipine (NORVASC) 10 MG tablet Take 1 tablet (10 mg total) by mouth daily. 11/28/22   Georganna Skeans, MD  aspirin 81 MG chewable tablet Chew 81 mg by mouth.    [provider]  Flavoring Agent (VITAMIN/IRON MASKING AGENT) LIQD Take 10 mLs by mouth See admin instructions. 10ml bid 20 ml qd    [provider]  hydrochlorothiazide (HYDRODIURIL) 25 MG tablet Take 1 tablet (25 mg total) by mouth daily. 11/28/22   Georganna Skeans, MD  lisinopril (ZESTRIL) 10 MG tablet Take 1 tablet (10 mg total) by mouth daily. 11/28/22   Georganna Skeans, MD  meloxicam (MOBIC) 7.5 MG tablet Take 7.5 mg by mouth 2 (two) times daily as needed. 12/27/22   [provider]  methocarbamol (ROBAXIN) 750 MG tablet Take 750 mg by mouth 2 (two) times daily as needed. 12/25/22   [provider]  omeprazole (PRILOSEC) 20 MG capsule Take 1 capsule (20 mg total) by mouth daily. Patient taking differently: Take 20 mg by mouth daily as needed (Acid reflux). 09/20/22   Arby Barrette, MD  ondansetron (ZOFRAN-ODT) 4 MG disintegrating tablet Take 1 tablet (4 mg total) by mouth every 8 (eight) hours as  needed for nausea or vomiting. 01/05/23   Carlisle Beers, FNP  PARoxetine (PAXIL) 30 MG tablet Take 1 tablet (30 mg total) by mouth daily. 11/28/22   Georganna Skeans, MD    Family History Family History  Problem Relation Age of Onset   Cancer Mother    Heart disease Mother    Lung cancer Father    Diabetes Maternal Uncle    Colon polyps Neg Hx    Colon cancer Neg Hx    Esophageal cancer Neg Hx    Stomach cancer Neg Hx    Rectal cancer Neg Hx     Social History Social History   Tobacco Use   Smoking status: Never   Smokeless tobacco:  Never  Vaping Use   Vaping status: Never Used  Substance Use Topics   Alcohol use: No   Drug use: No     Allergies   Other; Lidocaine; Pollen extract; Shellfish-derived products; Venofer [iron sucrose]; Antihistamines, diphenhydramine-type; and Feraheme [ferumoxytol]   Review of Systems Review of Systems  Constitutional:  Positive for appetite change and fatigue.  Cardiovascular:  Negative for chest pain.  Gastrointestinal:  Positive for abdominal pain, constipation, nausea and vomiting. Negative for blood in stool.     Physical Exam Triage Vital Signs ED Triage Vitals  Encounter Vitals Group     BP 01/12/23 1112 (!) 155/92     Systolic BP Percentile --      Diastolic BP Percentile --      Pulse Rate 01/12/23 1112 80     Resp 01/12/23 1112 14     Temp 01/12/23 1112 98.1 F (36.7 C)     Temp Source 01/12/23 1112 Oral     SpO2 01/12/23 1112 98 %     Weight --      Height --      Head Circumference --      Peak Flow --      Pain Score 01/12/23 1111 5     Pain Loc --      Pain Education --      Exclude from Growth Chart --    No data found.  Updated Vital Signs BP (!) 155/92 (BP Location: Right Arm)   Pulse 80   Temp 98.1 F (36.7 C) (Oral)   Resp 14   SpO2 98%   Visual Acuity Right Eye Distance:   Left Eye Distance:   Bilateral Distance:    Right Eye Near:   Left Eye Near:    Bilateral Near:     Physical Exam Constitutional:      Appearance: He is ill-appearing.     Comments: Sallow, pale  HENT:     Head: Normocephalic.     Mouth/Throat:     Mouth: Mucous membranes are moist.     Comments: Mucous membranes are pale Eyes:     Comments: Scleral icterus  Cardiovascular:     Rate and Rhythm: Normal rate and regular rhythm.  Pulmonary:     Effort: Pulmonary effort is normal. No respiratory distress.  Abdominal:     Comments: Diffuse upper abdominal tenderness worse right upper quadrant  Skin:    Coloration: Skin is pale.  Neurological:      Mental Status: He is oriented to person, place, and time.      UC Treatments / Results  Labs (all labs ordered are listed, but only abnormal results are displayed) Labs Reviewed - No data to display  EKG   Radiology No results found.  Procedures Procedures (including critical care time)  Medications Ordered in UC Medications - No data to display  Initial Impression / Assessment and Plan / UC Course  I have reviewed the triage vital signs and the nursing notes.  Pertinent labs & imaging results that were available during my care of the patient were reviewed by me and considered in my medical decision making (see chart for details).     Patient was counseled to go to the emergency department as he needs a full workup for abdominal pain and jaundice.  Differential diagnosis includes hepatitis, cirrhosis, cancer, GI bleed cholecystitis, pancreatitis.Marland Kitchen  He was stable upon discharge Final Clinical Impressions(s) / UC Diagnoses   Final diagnoses:  Abdominal pain, unspecified abdominal location  Jaundice   Discharge Instructions   None    ED Prescriptions   None    PDMP not reviewed this encounter.   Meliton Rattan, [place] 01/12/23 1134

## 2023-01-12 NOTE — ED Notes (Signed)
Patient is being discharged from the Urgent Care and sent to the Emergency Department via POV . Per Angie, PA, patient is in need of higher level of care due to need of further evaluation. Patient is aware and verbalizes understanding of plan of care.  Vitals:   01/12/23 1112  BP: (!) 155/92  Pulse: 80  Resp: 14  Temp: 98.1 F (36.7 C)  SpO2: 98%

## 2023-01-12 NOTE — ED Triage Notes (Signed)
Patient states that he was seen recently for the same abdominal issues. He states he still has to strain to have a bowel movement and still having a lot of gas. States his last bowel movement took 25 minutes and he was instructed to return if this happened again.

## 2023-01-15 ENCOUNTER — Inpatient Hospital Stay: Payer: BC Managed Care – PPO | Attending: Oncology

## 2023-01-15 ENCOUNTER — Encounter: Payer: Self-pay | Admitting: Family Medicine

## 2023-01-15 ENCOUNTER — Inpatient Hospital Stay (HOSPITAL_BASED_OUTPATIENT_CLINIC_OR_DEPARTMENT_OTHER): Payer: BC Managed Care – PPO | Admitting: Oncology

## 2023-01-15 ENCOUNTER — Other Ambulatory Visit: Payer: Self-pay | Admitting: Oncology

## 2023-01-15 ENCOUNTER — Ambulatory Visit: Payer: Self-pay | Admitting: *Deleted

## 2023-01-15 ENCOUNTER — Ambulatory Visit (INDEPENDENT_AMBULATORY_CARE_PROVIDER_SITE_OTHER): Payer: BC Managed Care – PPO | Admitting: Family Medicine

## 2023-01-15 VITALS — BP 169/111 | HR 96 | Temp 98.3°F | Resp 18 | Ht 70.0 in | Wt 208.0 lb

## 2023-01-15 VITALS — BP 132/81 | HR 86 | Temp 98.0°F | Resp 18 | Ht 70.0 in | Wt 210.6 lb

## 2023-01-15 DIAGNOSIS — I10 Essential (primary) hypertension: Secondary | ICD-10-CM | POA: Diagnosis not present

## 2023-01-15 DIAGNOSIS — C787 Secondary malignant neoplasm of liver and intrahepatic bile duct: Secondary | ICD-10-CM | POA: Diagnosis not present

## 2023-01-15 DIAGNOSIS — Z923 Personal history of irradiation: Secondary | ICD-10-CM | POA: Diagnosis not present

## 2023-01-15 DIAGNOSIS — K8689 Other specified diseases of pancreas: Secondary | ICD-10-CM | POA: Diagnosis not present

## 2023-01-15 DIAGNOSIS — D509 Iron deficiency anemia, unspecified: Secondary | ICD-10-CM | POA: Diagnosis not present

## 2023-01-15 DIAGNOSIS — F419 Anxiety disorder, unspecified: Secondary | ICD-10-CM | POA: Diagnosis not present

## 2023-01-15 DIAGNOSIS — Z8547 Personal history of malignant neoplasm of testis: Secondary | ICD-10-CM | POA: Insufficient documentation

## 2023-01-15 DIAGNOSIS — Z9221 Personal history of antineoplastic chemotherapy: Secondary | ICD-10-CM | POA: Insufficient documentation

## 2023-01-15 DIAGNOSIS — C259 Malignant neoplasm of pancreas, unspecified: Secondary | ICD-10-CM

## 2023-01-15 DIAGNOSIS — F32A Depression, unspecified: Secondary | ICD-10-CM

## 2023-01-15 DIAGNOSIS — R17 Unspecified jaundice: Secondary | ICD-10-CM | POA: Diagnosis not present

## 2023-01-15 DIAGNOSIS — Z85810 Personal history of malignant neoplasm of tongue: Secondary | ICD-10-CM | POA: Diagnosis not present

## 2023-01-15 HISTORY — DX: Malignant neoplasm of pancreas, unspecified: C25.9

## 2023-01-15 LAB — CMP (CANCER CENTER ONLY)
ALT: 80 U/L — ABNORMAL HIGH (ref 0–44)
AST: 95 U/L — ABNORMAL HIGH (ref 15–41)
Albumin: 3.6 g/dL (ref 3.5–5.0)
Alkaline Phosphatase: 502 U/L — ABNORMAL HIGH (ref 38–126)
Anion gap: 13 (ref 5–15)
BUN: 14 mg/dL (ref 6–20)
CO2: 31 mmol/L (ref 22–32)
Calcium: 9.5 mg/dL (ref 8.9–10.3)
Chloride: 89 mmol/L — ABNORMAL LOW (ref 98–111)
Creatinine: 0.8 mg/dL (ref 0.61–1.24)
GFR, Estimated: >60 mL/min (ref >60)
Glucose, Bld: 165 mg/dL — ABNORMAL HIGH (ref 70–99)
Potassium: 3.3 mmol/L — ABNORMAL LOW (ref 3.5–5.1)
Sodium: 133 mmol/L — ABNORMAL LOW (ref 135–145)
Total Bilirubin: 7.2 mg/dL (ref <1.2)
Total Protein: 6.9 g/dL (ref 6.5–8.1)

## 2023-01-15 LAB — CBC WITH DIFFERENTIAL (CANCER CENTER ONLY)
Abs Immature Granulocytes: 0 10*3/uL (ref 0.00–0.07)
Basophils Absolute: 0.1 10*3/uL (ref 0.0–0.1)
Basophils Relative: 1 %
Eosinophils Absolute: 0.4 10*3/uL (ref 0.0–0.5)
Eosinophils Relative: 3 %
HCT: 28.6 % — ABNORMAL LOW (ref 39.0–52.0)
Hemoglobin: 8.7 g/dL — ABNORMAL LOW (ref 13.0–17.0)
Immature Granulocytes: 0 %
Lymphocytes Relative: 6 %
Lymphs Abs: 0.8 10*3/uL (ref 0.7–4.0)
MCH: 18.8 pg — ABNORMAL LOW (ref 26.0–34.0)
MCHC: 30.4 g/dL (ref 30.0–36.0)
MCV: 61.8 fL — ABNORMAL LOW (ref 80.0–100.0)
Monocytes Absolute: 1 10*3/uL (ref 0.1–1.0)
Monocytes Relative: 8 %
Neutro Abs: 10.2 10*3/uL — ABNORMAL HIGH (ref 1.7–7.7)
Neutrophils Relative %: 82 %
Platelet Count: 640 10*3/uL — ABNORMAL HIGH (ref 150–400)
RBC: 4.63 MIL/uL (ref 4.22–5.81)
RDW: 23.5 % — ABNORMAL HIGH (ref 11.5–15.5)
Smear Review: INCREASED
WBC Count: 12.5 10*3/uL — ABNORMAL HIGH (ref 4.0–10.5)
nRBC: 0 % (ref 0.0–0.2)
nRBC: 0 /100{WBCs}

## 2023-01-15 LAB — IRON AND TIBC
Iron: 22 ug/dL — ABNORMAL LOW (ref 45–182)
Saturation Ratios: 5 % — ABNORMAL LOW (ref 17.9–39.5)
TIBC: 444 ug/dL (ref 250–450)
UIBC: 422 ug/dL

## 2023-01-15 LAB — FERRITIN: Ferritin: 45 ng/mL (ref 24–336)

## 2023-01-15 MED ORDER — ONDANSETRON 4 MG PO TBDP: 4.0000 mg | ORAL_TABLET | Freq: Three times a day (TID) | ORAL | 0 refills | Status: AC | PRN

## 2023-01-15 MED ORDER — HYDROCODONE-ACETAMINOPHEN 10-325 MG PO TABS: 1.0000 | ORAL_TABLET | Freq: Four times a day (QID) | ORAL | 0 refills | Status: AC | PRN

## 2023-01-15 NOTE — Progress Notes (Signed)
[facility]  [address] [place],  [place]  [address] [phone]  Clinic Day:  11/14/2022  Referring physician: Georganna Skeans, MD  HISTORY OF PRESENT ILLNESS:  The patient is a 57 y.o. male with severe iron deficiency anemia.  This led to him receiving IV iron in October 2024.  However, he comes in today as scans over the Thanksgiving holiday unfortunately showed multiple cystic lesions at his pancreatic head, as well as multiple liver lesions.  Such findings are essentially consistent  with metastatic pancreatic cancer.  He comes in today to discuss how he wishes to handle this disease process moving forward.  The patient recalls having increased constipation over the past few weeks.  He has had a moderate amount of abdominal discomfort.  As his constipation worsened and his family began noticing him to have scleral icterus, he came to the local emergency room for further evaluation, which included the aforementioned scans.  Understandably, he is distraught with the new findings seen per his scans.  Currently, he does not feel like going through additional lines of chemotherapy, particularly if it is not being given for curative intent.  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  He completed concurrent chemoradiation, which consisted of weekly carboplatin/paclitaxel, in May 2019.  All scans and physical exams since then have shown no signs of disease recurrence.  He denies having any new symptoms/findings which concern him for late disease recurrence.  He is considered cured of this cancer.  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  Scans and exams since then never showed signs of disease recurrence.  He has been considered cured of this disease.    PHYSICAL EXAM:  Blood  pressure (!) 169/111, pulse 96, temperature 98.3 F (36.8 C), resp. rate 18, height 5\' 10"  (1.778 m), weight 208 lb (94.3 kg), SpO2 99%. Wt Readings from Last 3 Encounters:  01/15/23 208 lb (94.3 kg)  11/28/22 218 lb 9.6 oz (99.2 kg)  11/17/22 222 lb (100.7 kg)   Body mass index is 29.84 kg/m. Performance status (ECOG): 0 Physical Exam Constitutional:      Appearance: Normal appearance. He is not ill-appearing.  HENT:     Mouth/Throat:     Mouth: Mucous membranes are moist.     Pharynx: Oropharynx is clear. No oropharyngeal exudate or posterior oropharyngeal erythema.  Cardiovascular:     Rate and Rhythm: Normal rate and regular rhythm.     Heart sounds: No murmur heard.    No friction rub. No gallop.  Pulmonary:     Effort: Pulmonary effort is normal. No respiratory distress.     Breath sounds: Normal breath sounds. No wheezing, rhonchi or rales.  Abdominal:     General: Bowel sounds are normal. There is no distension.     Palpations: Abdomen is soft. There is no mass.     Tenderness: There is no abdominal tenderness.  Musculoskeletal:        General: No swelling.     Right lower leg: No edema.     Left lower leg: No edema.  Lymphadenopathy:     Cervical: No cervical adenopathy.     Upper Body:     Right upper body: No supraclavicular or axillary adenopathy.     Left upper body:  No supraclavicular or axillary adenopathy.     Lower Body: No right inguinal adenopathy. No left inguinal adenopathy.  Skin:    General: Skin is warm.     Coloration: Skin is not jaundiced.     Findings: No lesion or rash.  Neurological:     General: No focal deficit present.     Mental Status: He is alert and oriented to person, place, and time. Mental status is at baseline.  Psychiatric:        Mood and Affect: Mood normal.        Behavior: Behavior normal.        Thought Content: Thought content normal.   LABS:  Latest Reference Range & Units 01/15/23 09:52  Sodium 135 - 145 mmol/L 133  (L)  Potassium 3.5 - 5.1 mmol/L 3.3 (L)  Chloride 98 - 111 mmol/L 89 (L)  CO2 22 - 32 mmol/L 31  Glucose 70 - 99 mg/dL 161 (H)  BUN 6 - 20 mg/dL 14  Creatinine 0.96 - 0.45 mg/dL 4.09  Calcium 8.9 - 81.1 mg/dL 9.5  Anion gap 5 - 15  13  Alkaline Phosphatase 38 - 126 U/L 502 (H)  Albumin 3.5 - 5.0 g/dL 3.6  AST 15 - 41 U/L 95 (H)  ALT 0 - 44 U/L 80 (H)  Total Protein 6.5 - 8.1 g/dL 6.9  Total Bilirubin <9.1 mg/dL 7.2 (HH)  GFR, Est Non African American >60 mL/min >60  WBC 4.0 - 10.5 K/uL 12.5 (H)  RBC 4.22 - 5.81 MIL/uL 4.63  Hemoglobin 13.0 - 17.0 g/dL 8.7 (L)  HCT 47.8 - 29.5 % 28.6 (L)  MCV 80.0 - 100.0 fL 61.8 (L)  MCH 26.0 - 34.0 pg 18.8 (L)  MCHC 30.0 - 36.0 g/dL 62.1  RDW 30.8 - 65.7 % 23.5 (H)  Platelets 150 - 400 K/uL 640 (H)  nRBC 0.0 - 0.2 % 0 /100 WBC 0.00 0  Neutrophils % 82  Lymphocytes % 6  Monocytes Relative % 8  Eosinophil % 3  Basophil % 1  Immature Granulocytes % 0  NEUT# 1.7 - 7.7 K/uL 10.2 (H)  Lymphs Abs 0.7 - 4.0 K/uL 0.8  Monocyte # 0.1 - 1.0 K/uL 1.0  Eosinophils Absolute 0.0 - 0.5 K/uL 0.4  Basophils Absolute 0.0 - 0.1 K/uL 0.1  Abs Immature Granulocytes 0.00 - 0.07 K/uL 0.00  RBC Morphology  ELLIPTOCYTES  Polychromasia  POLYCHROMASIA PRESENT  Target Cells  TARGET CELLS  Tear Drop Cells  TEARDROP CELLS  WBC Morphology  MORPHOLOGY UNREMARKABLE  Smear Review  PLATELETS APPEAR INCREASED  (HH): Data is critically high (L): Data is abnormally low (H): Data is abnormally high  ASSESSMENT & PLAN:  Assessment/Plan:  A 57 y.o. male who unfortunately appears to have radiographic evidence of metastatic pancreatic cancer.  In clinic today, I went over all of his CT scan images with him, for which he could see how the large cystic lesions were blocking his common bile duct, leading to hyperbilirubinemia being present.  I did give the patient the option of undergoing an ERCP with stent placement, as well as a biopsy to determine the exact type of cancer  this gentleman likely has.  However, he declined having any of those interventions done.  As he understands his disease can ultimately not be cured, he is not interested in pursuing chemotherapy or additional doses of Lovenox at this time.  I we will ultimately have this gentleman evaluated for hospice care.  As things currently  stand, my concern is his life expectancy may not  extend beyond the next 2-3 months.  I will work with hospice in providing this patient what ever pain medicine and other supportive therapy needed to try to maintain his daily quality of life at a certain level for as long as possible.  No scheduled follow-up visits will be made.  However, the patient knows to contact our office at any time if additional supportive care is considered necessary.  Although very despondent, the patient understands all the plans discussed today and is in agreement with them.  Linzee Depaul Kirby Funk, MD

## 2023-01-15 NOTE — Progress Notes (Signed)
CRITICAL VALUE STICKER  CRITICAL VALUE:  Total Bili 7.2  RECEIVER (on-site recipient of call):  Dyane Dustman RN  DATE & TIME NOTIFIED:   01/15/2023 @ 1037  MESSENGER (representative from lab):  [facility]  MD NOTIFIED:   Dr. Melvyn Neth  TIME OF NOTIFICATION:  1039  RESPONSE:   Patient referred to hospice.

## 2023-01-15 NOTE — Progress Notes (Unsigned)
Established Patient Office Visit  Subjective    Patient ID: Matthew Hatfield, male    DOB: September 19, 1965  Age: 57 y.o. MRN: [id number]  CC:  Chief Complaint  Patient presents with   Medication Refill    Cancer diagnosis    HPI Matthew Hatfield presents for follow up. Patient reports that he had been having abdominal sx with negative workup. He became jaundiced acutely on last week and was seen in ED. Work up note probable stage 4 pancreatic cancer with a life expectancy of 2-3 months. Patient reports that he is doing well as can be expected. He has done much to take care of arrangements over the past 3 days and has made peace with his situation.   Outpatient Encounter Medications as of 01/15/2023  Medication Sig   amLODipine (NORVASC) 10 MG tablet Take 1 tablet (10 mg total) by mouth daily.   aspirin 81 MG chewable tablet Chew 81 mg by mouth.   Flavoring Agent (VITAMIN/IRON MASKING AGENT) LIQD Take 10 mLs by mouth See admin instructions. 10ml bid 20 ml qd   hydrochlorothiazide (HYDRODIURIL) 25 MG tablet Take 1 tablet (25 mg total) by mouth daily.   HYDROcodone-acetaminophen (NORCO) 10-325 MG tablet Take 1 tablet by mouth every 6 (six) hours as needed.   lisinopril (ZESTRIL) 10 MG tablet Take 1 tablet (10 mg total) by mouth daily.   meloxicam (MOBIC) 7.5 MG tablet Take 7.5 mg by mouth 2 (two) times daily as needed.   methocarbamol (ROBAXIN) 750 MG tablet Take 750 mg by mouth 2 (two) times daily as needed.   omeprazole (PRILOSEC) 20 MG capsule Take 1 capsule (20 mg total) by mouth daily. (Patient taking differently: Take 20 mg by mouth daily as needed (Acid reflux).)   ondansetron (ZOFRAN-ODT) 4 MG disintegrating tablet Take 1 tablet (4 mg total) by mouth every 8 (eight) hours as needed for nausea or vomiting.   PARoxetine (PAXIL) 30 MG tablet Take 1 tablet (30 mg total) by mouth daily.   No facility-administered encounter medications on file as of 01/15/2023.    Past Medical History:   Diagnosis Date   Cancer of base of tongue (HCC)    GERD (gastroesophageal reflux disease)    Hyperlipidemia    Hypertension    Personal history of testicular cancer    Throat cancer (HCC)     Past Surgical History:  Procedure Laterality Date   CHOLECYSTECTOMY     COLONOSCOPY     HERNIA REPAIR     KNEE SURGERY     RADICAL ORCHIECTOMY Right    TONSILLECTOMY      Family History  Problem Relation Age of Onset   Cancer Mother    Heart disease Mother    Lung cancer Father    Diabetes Maternal Uncle    Colon polyps Neg Hx    Colon cancer Neg Hx    Esophageal cancer Neg Hx    Stomach cancer Neg Hx    Rectal cancer Neg Hx     Social History   Socioeconomic History   Marital status: Widowed    Spouse name: Not on file   Number of children: Not on file   Years of education: Not on file   Highest education level: Not on file  Occupational History   Not on file  Tobacco Use   Smoking status: Never   Smokeless tobacco: Never  Vaping Use   Vaping status: Never Used  Substance and Sexual Activity   Alcohol use: No  Drug use: No   Sexual activity: Not on file  Other Topics Concern   Not on file  Social History Narrative   Not on file   Social Determinants of Health   Financial Resource Strain: Low Risk  (11/24/2022)   Overall Financial Resource Strain (CARDIA)    Difficulty of Paying Living Expenses: Not very hard  Food Insecurity: No Food Insecurity (11/24/2022)   Hunger Vital Sign    Worried About Running Out of Food in the Last Year: Never true    Ran Out of Food in the Last Year: Never true  Transportation Needs: No Transportation Needs (11/24/2022)   PRAPARE - Administrator, Civil Service (Medical): No    Lack of Transportation (Non-Medical): No  Physical Activity: Unknown (11/24/2022)   Exercise Vital Sign    Days of Exercise per Week: 1 day    Minutes of Exercise per Session: Patient declined  Stress: No Stress Concern Present (11/24/2022)    [employer] of Occupational Health - Occupational Stress Questionnaire    Feeling of Stress : Not at all  Social Connections: Unknown (11/24/2022)   Social Connection and Isolation Panel [NHANES]    Frequency of Communication with Friends and Family: Patient declined    Frequency of Social Gatherings with Friends and Family: Patient declined    Attends Religious Services: Never    Database administrator or Organizations: No    Attends Engineer, structural: 1 to 4 times per year    Marital Status: Widowed  Intimate Partner Violence: Not At Risk (06/12/2022)   Received from [facility]   HITS    Over the last 12 months how often did your partner physically hurt you?: Never    Over the last 12 months how often did your partner insult you or talk down to you?: Never    Over the last 12 months how often did your partner threaten you with physical harm?: Never    Over the last 12 months how often did your partner scream or curse at you?: Never    Review of Systems  Gastrointestinal:  Positive for abdominal pain.  All other systems reviewed and are negative.       Objective    BP 132/81 (BP Location: Right Arm, Patient Position: Sitting, Cuff Size: Normal)   Pulse 86   Temp 98 F (36.7 C) (Oral)   Resp 18   Ht 5\' 10"  (1.778 m)   Wt 210 lb 9.6 oz (95.5 kg)   SpO2 98%   BMI 30.22 kg/m   Physical Exam Vitals and nursing note reviewed.  Constitutional:      General: He is not in acute distress. Cardiovascular:     Rate and Rhythm: Normal rate and regular rhythm.  Pulmonary:     Effort: Pulmonary effort is normal.     Breath sounds: Normal breath sounds.  Abdominal:     Palpations: Abdomen is soft.     Tenderness: There is no abdominal tenderness.  Skin:    Coloration: Skin is jaundiced.  Neurological:     General: No focal deficit present.     Mental Status: He is alert and oriented to person, place, and time.  Psychiatric:        Mood  and Affect: Mood and affect normal.         Assessment & Plan:   Pancreatic cancer metastasized to liver (HCC)  Jaundice  Essential hypertension  Anxiety and depression  Return if symptoms worsen or fail to improve.   Tommie Raymond, MD

## 2023-01-15 NOTE — Telephone Encounter (Signed)
Summary: pain medication   Per agent: "Pt states he needs the hydrocodone filled today.  He said he went to [facility] emergency room and was dx w/ pancreatic cancer.  He states it is very important he see the dr today b/c he will be out ot this medication, and he cannot do anything will the pain medication. He was very addiment about this.        Chief Complaint: Medication refill Symptoms: NA Frequency: NA Pertinent Negatives: Patient denies NA Disposition: [] ED /[] Urgent Care (no appt availability in office) / [] Appointment(In office/virtual)/ []  [facility]/ [] Home Care/ [] Refused Recommended Disposition /[] [place] [facility]/ [x]  Follow-up with PCP Additional Notes:   Pt states he went to UC in [place] States they sent him to ED, went to [facility] and was diagnosed with pancreatic cancer with mets to liver. Requesting refill of hydrocodone, not on current med profile. Also requesting refill of Zofran, historical provider. Pt has appt with oncologist this AM. Assured pt NT would route to Dr. Andrey Campanile but advised to ask oncologist to refill meds as would need appt with PCP and none available. Pt verbalizes understanding.   Reason for Disposition  [1] Caller has URGENT medicine question about med that PCP or specialist prescribed AND [2] triager unable to answer question  Answer Assessment - Initial Assessment Questions 1. NAME of MEDICINE: "What medicine(s) are you calling about?"     Hydrocodone and Zofran 2. QUESTION: "What is your question?" (e.g., double dose of medicine, side effect)     Prescriptions  Protocols used: Medication Question Call-A-AH

## 2023-01-16 DIAGNOSIS — Z85810 Personal history of malignant neoplasm of tongue: Secondary | ICD-10-CM | POA: Diagnosis not present

## 2023-01-16 DIAGNOSIS — F32A Depression, unspecified: Secondary | ICD-10-CM | POA: Diagnosis not present

## 2023-01-16 DIAGNOSIS — C250 Malignant neoplasm of head of pancreas: Secondary | ICD-10-CM | POA: Diagnosis not present

## 2023-01-16 DIAGNOSIS — C259 Malignant neoplasm of pancreas, unspecified: Secondary | ICD-10-CM | POA: Diagnosis not present

## 2023-01-16 DIAGNOSIS — I10 Essential (primary) hypertension: Secondary | ICD-10-CM | POA: Diagnosis not present

## 2023-01-16 DIAGNOSIS — D509 Iron deficiency anemia, unspecified: Secondary | ICD-10-CM | POA: Diagnosis not present

## 2023-01-16 DIAGNOSIS — C787 Secondary malignant neoplasm of liver and intrahepatic bile duct: Secondary | ICD-10-CM | POA: Diagnosis not present

## 2023-01-16 DIAGNOSIS — Z8547 Personal history of malignant neoplasm of testis: Secondary | ICD-10-CM | POA: Diagnosis not present

## 2023-01-17 ENCOUNTER — Inpatient Hospital Stay: Payer: BC Managed Care – PPO

## 2023-01-17 ENCOUNTER — Inpatient Hospital Stay: Payer: BC Managed Care – PPO | Admitting: Oncology

## 2023-01-17 ENCOUNTER — Encounter: Payer: Self-pay | Admitting: Family Medicine

## 2023-01-17 DIAGNOSIS — I10 Essential (primary) hypertension: Secondary | ICD-10-CM | POA: Diagnosis not present

## 2023-01-17 DIAGNOSIS — C259 Malignant neoplasm of pancreas, unspecified: Secondary | ICD-10-CM | POA: Diagnosis not present

## 2023-01-17 DIAGNOSIS — F32A Depression, unspecified: Secondary | ICD-10-CM | POA: Diagnosis not present

## 2023-01-17 DIAGNOSIS — Z8547 Personal history of malignant neoplasm of testis: Secondary | ICD-10-CM | POA: Diagnosis not present

## 2023-01-17 DIAGNOSIS — C787 Secondary malignant neoplasm of liver and intrahepatic bile duct: Secondary | ICD-10-CM | POA: Diagnosis not present

## 2023-01-17 DIAGNOSIS — D509 Iron deficiency anemia, unspecified: Secondary | ICD-10-CM | POA: Diagnosis not present

## 2023-01-17 DIAGNOSIS — C250 Malignant neoplasm of head of pancreas: Secondary | ICD-10-CM | POA: Diagnosis not present

## 2023-01-17 DIAGNOSIS — Z85810 Personal history of malignant neoplasm of tongue: Secondary | ICD-10-CM | POA: Diagnosis not present

## 2023-01-18 DIAGNOSIS — C787 Secondary malignant neoplasm of liver and intrahepatic bile duct: Secondary | ICD-10-CM | POA: Diagnosis not present

## 2023-01-18 DIAGNOSIS — F32A Depression, unspecified: Secondary | ICD-10-CM | POA: Diagnosis not present

## 2023-01-18 DIAGNOSIS — C259 Malignant neoplasm of pancreas, unspecified: Secondary | ICD-10-CM | POA: Diagnosis not present

## 2023-01-18 DIAGNOSIS — I10 Essential (primary) hypertension: Secondary | ICD-10-CM | POA: Diagnosis not present

## 2023-01-18 DIAGNOSIS — C250 Malignant neoplasm of head of pancreas: Secondary | ICD-10-CM | POA: Diagnosis not present

## 2023-01-18 DIAGNOSIS — Z85810 Personal history of malignant neoplasm of tongue: Secondary | ICD-10-CM | POA: Diagnosis not present

## 2023-01-18 DIAGNOSIS — D509 Iron deficiency anemia, unspecified: Secondary | ICD-10-CM | POA: Diagnosis not present

## 2023-01-18 DIAGNOSIS — Z8547 Personal history of malignant neoplasm of testis: Secondary | ICD-10-CM | POA: Diagnosis not present

## 2023-01-19 DIAGNOSIS — C259 Malignant neoplasm of pancreas, unspecified: Secondary | ICD-10-CM | POA: Diagnosis not present

## 2023-01-19 DIAGNOSIS — D509 Iron deficiency anemia, unspecified: Secondary | ICD-10-CM | POA: Diagnosis not present

## 2023-01-19 DIAGNOSIS — C787 Secondary malignant neoplasm of liver and intrahepatic bile duct: Secondary | ICD-10-CM | POA: Diagnosis not present

## 2023-01-19 DIAGNOSIS — Z8547 Personal history of malignant neoplasm of testis: Secondary | ICD-10-CM | POA: Diagnosis not present

## 2023-01-19 DIAGNOSIS — F32A Depression, unspecified: Secondary | ICD-10-CM | POA: Diagnosis not present

## 2023-01-19 DIAGNOSIS — C250 Malignant neoplasm of head of pancreas: Secondary | ICD-10-CM | POA: Diagnosis not present

## 2023-01-19 DIAGNOSIS — Z85810 Personal history of malignant neoplasm of tongue: Secondary | ICD-10-CM | POA: Diagnosis not present

## 2023-01-19 DIAGNOSIS — I10 Essential (primary) hypertension: Secondary | ICD-10-CM | POA: Diagnosis not present

## 2023-01-20 DIAGNOSIS — D509 Iron deficiency anemia, unspecified: Secondary | ICD-10-CM | POA: Diagnosis not present

## 2023-01-20 DIAGNOSIS — I10 Essential (primary) hypertension: Secondary | ICD-10-CM | POA: Diagnosis not present

## 2023-01-20 DIAGNOSIS — F32A Depression, unspecified: Secondary | ICD-10-CM | POA: Diagnosis not present

## 2023-01-20 DIAGNOSIS — Z8547 Personal history of malignant neoplasm of testis: Secondary | ICD-10-CM | POA: Diagnosis not present

## 2023-01-20 DIAGNOSIS — C787 Secondary malignant neoplasm of liver and intrahepatic bile duct: Secondary | ICD-10-CM | POA: Diagnosis not present

## 2023-01-20 DIAGNOSIS — C259 Malignant neoplasm of pancreas, unspecified: Secondary | ICD-10-CM | POA: Diagnosis not present

## 2023-01-20 DIAGNOSIS — C250 Malignant neoplasm of head of pancreas: Secondary | ICD-10-CM | POA: Diagnosis not present

## 2023-01-20 DIAGNOSIS — Z85810 Personal history of malignant neoplasm of tongue: Secondary | ICD-10-CM | POA: Diagnosis not present

## 2023-01-21 DIAGNOSIS — C787 Secondary malignant neoplasm of liver and intrahepatic bile duct: Secondary | ICD-10-CM | POA: Diagnosis not present

## 2023-01-21 DIAGNOSIS — F32A Depression, unspecified: Secondary | ICD-10-CM | POA: Diagnosis not present

## 2023-01-21 DIAGNOSIS — Z8547 Personal history of malignant neoplasm of testis: Secondary | ICD-10-CM | POA: Diagnosis not present

## 2023-01-21 DIAGNOSIS — C259 Malignant neoplasm of pancreas, unspecified: Secondary | ICD-10-CM | POA: Diagnosis not present

## 2023-01-21 DIAGNOSIS — D509 Iron deficiency anemia, unspecified: Secondary | ICD-10-CM | POA: Diagnosis not present

## 2023-01-21 DIAGNOSIS — C250 Malignant neoplasm of head of pancreas: Secondary | ICD-10-CM | POA: Diagnosis not present

## 2023-01-21 DIAGNOSIS — Z85810 Personal history of malignant neoplasm of tongue: Secondary | ICD-10-CM | POA: Diagnosis not present

## 2023-01-21 DIAGNOSIS — I10 Essential (primary) hypertension: Secondary | ICD-10-CM | POA: Diagnosis not present

## 2023-01-22 DIAGNOSIS — C259 Malignant neoplasm of pancreas, unspecified: Secondary | ICD-10-CM | POA: Diagnosis not present

## 2023-01-22 DIAGNOSIS — F32A Depression, unspecified: Secondary | ICD-10-CM | POA: Diagnosis not present

## 2023-01-22 DIAGNOSIS — D509 Iron deficiency anemia, unspecified: Secondary | ICD-10-CM | POA: Diagnosis not present

## 2023-01-22 DIAGNOSIS — Z85810 Personal history of malignant neoplasm of tongue: Secondary | ICD-10-CM | POA: Diagnosis not present

## 2023-01-22 DIAGNOSIS — I10 Essential (primary) hypertension: Secondary | ICD-10-CM | POA: Diagnosis not present

## 2023-01-22 DIAGNOSIS — Z8547 Personal history of malignant neoplasm of testis: Secondary | ICD-10-CM | POA: Diagnosis not present

## 2023-01-22 DIAGNOSIS — C787 Secondary malignant neoplasm of liver and intrahepatic bile duct: Secondary | ICD-10-CM | POA: Diagnosis not present

## 2023-01-22 DIAGNOSIS — C250 Malignant neoplasm of head of pancreas: Secondary | ICD-10-CM | POA: Diagnosis not present

## 2023-01-23 DIAGNOSIS — D509 Iron deficiency anemia, unspecified: Secondary | ICD-10-CM | POA: Diagnosis not present

## 2023-01-23 DIAGNOSIS — C259 Malignant neoplasm of pancreas, unspecified: Secondary | ICD-10-CM | POA: Diagnosis not present

## 2023-01-23 DIAGNOSIS — C787 Secondary malignant neoplasm of liver and intrahepatic bile duct: Secondary | ICD-10-CM | POA: Diagnosis not present

## 2023-01-23 DIAGNOSIS — F32A Depression, unspecified: Secondary | ICD-10-CM | POA: Diagnosis not present

## 2023-01-23 DIAGNOSIS — Z8547 Personal history of malignant neoplasm of testis: Secondary | ICD-10-CM | POA: Diagnosis not present

## 2023-01-23 DIAGNOSIS — Z85810 Personal history of malignant neoplasm of tongue: Secondary | ICD-10-CM | POA: Diagnosis not present

## 2023-01-23 DIAGNOSIS — I10 Essential (primary) hypertension: Secondary | ICD-10-CM | POA: Diagnosis not present

## 2023-01-23 DIAGNOSIS — C250 Malignant neoplasm of head of pancreas: Secondary | ICD-10-CM | POA: Diagnosis not present

## 2023-01-24 DIAGNOSIS — Z8547 Personal history of malignant neoplasm of testis: Secondary | ICD-10-CM | POA: Diagnosis not present

## 2023-01-24 DIAGNOSIS — F32A Depression, unspecified: Secondary | ICD-10-CM | POA: Diagnosis not present

## 2023-01-24 DIAGNOSIS — Z85810 Personal history of malignant neoplasm of tongue: Secondary | ICD-10-CM | POA: Diagnosis not present

## 2023-01-24 DIAGNOSIS — D509 Iron deficiency anemia, unspecified: Secondary | ICD-10-CM | POA: Diagnosis not present

## 2023-01-24 DIAGNOSIS — C250 Malignant neoplasm of head of pancreas: Secondary | ICD-10-CM | POA: Diagnosis not present

## 2023-01-24 DIAGNOSIS — I10 Essential (primary) hypertension: Secondary | ICD-10-CM | POA: Diagnosis not present

## 2023-01-24 DIAGNOSIS — C787 Secondary malignant neoplasm of liver and intrahepatic bile duct: Secondary | ICD-10-CM | POA: Diagnosis not present

## 2023-01-24 DIAGNOSIS — C259 Malignant neoplasm of pancreas, unspecified: Secondary | ICD-10-CM | POA: Diagnosis not present

## 2023-01-25 DIAGNOSIS — C787 Secondary malignant neoplasm of liver and intrahepatic bile duct: Secondary | ICD-10-CM | POA: Diagnosis not present

## 2023-01-25 DIAGNOSIS — Z8547 Personal history of malignant neoplasm of testis: Secondary | ICD-10-CM | POA: Diagnosis not present

## 2023-01-25 DIAGNOSIS — C250 Malignant neoplasm of head of pancreas: Secondary | ICD-10-CM | POA: Diagnosis not present

## 2023-01-25 DIAGNOSIS — C259 Malignant neoplasm of pancreas, unspecified: Secondary | ICD-10-CM | POA: Diagnosis not present

## 2023-01-25 DIAGNOSIS — Z85810 Personal history of malignant neoplasm of tongue: Secondary | ICD-10-CM | POA: Diagnosis not present

## 2023-01-25 DIAGNOSIS — F32A Depression, unspecified: Secondary | ICD-10-CM | POA: Diagnosis not present

## 2023-01-25 DIAGNOSIS — I10 Essential (primary) hypertension: Secondary | ICD-10-CM | POA: Diagnosis not present

## 2023-01-25 DIAGNOSIS — D509 Iron deficiency anemia, unspecified: Secondary | ICD-10-CM | POA: Diagnosis not present

## 2023-01-26 ENCOUNTER — Ambulatory Visit: Payer: BC Managed Care – PPO | Admitting: Oncology

## 2023-01-26 ENCOUNTER — Other Ambulatory Visit: Payer: BC Managed Care – PPO

## 2023-01-26 DIAGNOSIS — C259 Malignant neoplasm of pancreas, unspecified: Secondary | ICD-10-CM | POA: Diagnosis not present

## 2023-01-26 DIAGNOSIS — Z85810 Personal history of malignant neoplasm of tongue: Secondary | ICD-10-CM | POA: Diagnosis not present

## 2023-01-26 DIAGNOSIS — Z8547 Personal history of malignant neoplasm of testis: Secondary | ICD-10-CM | POA: Diagnosis not present

## 2023-01-26 DIAGNOSIS — C787 Secondary malignant neoplasm of liver and intrahepatic bile duct: Secondary | ICD-10-CM | POA: Diagnosis not present

## 2023-01-26 DIAGNOSIS — D509 Iron deficiency anemia, unspecified: Secondary | ICD-10-CM | POA: Diagnosis not present

## 2023-01-26 DIAGNOSIS — C250 Malignant neoplasm of head of pancreas: Secondary | ICD-10-CM | POA: Diagnosis not present

## 2023-01-26 DIAGNOSIS — I10 Essential (primary) hypertension: Secondary | ICD-10-CM | POA: Diagnosis not present

## 2023-01-26 DIAGNOSIS — F32A Depression, unspecified: Secondary | ICD-10-CM | POA: Diagnosis not present

## 2023-01-27 DIAGNOSIS — C250 Malignant neoplasm of head of pancreas: Secondary | ICD-10-CM | POA: Diagnosis not present

## 2023-01-27 DIAGNOSIS — Z8547 Personal history of malignant neoplasm of testis: Secondary | ICD-10-CM | POA: Diagnosis not present

## 2023-01-27 DIAGNOSIS — C259 Malignant neoplasm of pancreas, unspecified: Secondary | ICD-10-CM | POA: Diagnosis not present

## 2023-01-27 DIAGNOSIS — I10 Essential (primary) hypertension: Secondary | ICD-10-CM | POA: Diagnosis not present

## 2023-01-27 DIAGNOSIS — C787 Secondary malignant neoplasm of liver and intrahepatic bile duct: Secondary | ICD-10-CM | POA: Diagnosis not present

## 2023-01-27 DIAGNOSIS — F32A Depression, unspecified: Secondary | ICD-10-CM | POA: Diagnosis not present

## 2023-01-27 DIAGNOSIS — D509 Iron deficiency anemia, unspecified: Secondary | ICD-10-CM | POA: Diagnosis not present

## 2023-01-27 DIAGNOSIS — Z85810 Personal history of malignant neoplasm of tongue: Secondary | ICD-10-CM | POA: Diagnosis not present

## 2023-01-28 DIAGNOSIS — I10 Essential (primary) hypertension: Secondary | ICD-10-CM | POA: Diagnosis not present

## 2023-01-28 DIAGNOSIS — D509 Iron deficiency anemia, unspecified: Secondary | ICD-10-CM | POA: Diagnosis not present

## 2023-01-28 DIAGNOSIS — Z85810 Personal history of malignant neoplasm of tongue: Secondary | ICD-10-CM | POA: Diagnosis not present

## 2023-01-28 DIAGNOSIS — C259 Malignant neoplasm of pancreas, unspecified: Secondary | ICD-10-CM | POA: Diagnosis not present

## 2023-01-28 DIAGNOSIS — F32A Depression, unspecified: Secondary | ICD-10-CM | POA: Diagnosis not present

## 2023-01-28 DIAGNOSIS — C250 Malignant neoplasm of head of pancreas: Secondary | ICD-10-CM | POA: Diagnosis not present

## 2023-01-28 DIAGNOSIS — Z8547 Personal history of malignant neoplasm of testis: Secondary | ICD-10-CM | POA: Diagnosis not present

## 2023-01-28 DIAGNOSIS — C787 Secondary malignant neoplasm of liver and intrahepatic bile duct: Secondary | ICD-10-CM | POA: Diagnosis not present

## 2023-01-29 DIAGNOSIS — Z85810 Personal history of malignant neoplasm of tongue: Secondary | ICD-10-CM | POA: Diagnosis not present

## 2023-01-29 DIAGNOSIS — D509 Iron deficiency anemia, unspecified: Secondary | ICD-10-CM | POA: Diagnosis not present

## 2023-01-29 DIAGNOSIS — I10 Essential (primary) hypertension: Secondary | ICD-10-CM | POA: Diagnosis not present

## 2023-01-29 DIAGNOSIS — Z8547 Personal history of malignant neoplasm of testis: Secondary | ICD-10-CM | POA: Diagnosis not present

## 2023-01-29 DIAGNOSIS — C259 Malignant neoplasm of pancreas, unspecified: Secondary | ICD-10-CM | POA: Diagnosis not present

## 2023-01-29 DIAGNOSIS — C787 Secondary malignant neoplasm of liver and intrahepatic bile duct: Secondary | ICD-10-CM | POA: Diagnosis not present

## 2023-01-29 DIAGNOSIS — F32A Depression, unspecified: Secondary | ICD-10-CM | POA: Diagnosis not present

## 2023-01-29 DIAGNOSIS — C250 Malignant neoplasm of head of pancreas: Secondary | ICD-10-CM | POA: Diagnosis not present

## 2023-01-30 DIAGNOSIS — Z8547 Personal history of malignant neoplasm of testis: Secondary | ICD-10-CM | POA: Diagnosis not present

## 2023-01-30 DIAGNOSIS — C250 Malignant neoplasm of head of pancreas: Secondary | ICD-10-CM | POA: Diagnosis not present

## 2023-01-30 DIAGNOSIS — I10 Essential (primary) hypertension: Secondary | ICD-10-CM | POA: Diagnosis not present

## 2023-01-30 DIAGNOSIS — D509 Iron deficiency anemia, unspecified: Secondary | ICD-10-CM | POA: Diagnosis not present

## 2023-01-30 DIAGNOSIS — Z85810 Personal history of malignant neoplasm of tongue: Secondary | ICD-10-CM | POA: Diagnosis not present

## 2023-01-30 DIAGNOSIS — C259 Malignant neoplasm of pancreas, unspecified: Secondary | ICD-10-CM | POA: Diagnosis not present

## 2023-01-30 DIAGNOSIS — F32A Depression, unspecified: Secondary | ICD-10-CM | POA: Diagnosis not present

## 2023-01-30 DIAGNOSIS — C787 Secondary malignant neoplasm of liver and intrahepatic bile duct: Secondary | ICD-10-CM | POA: Diagnosis not present

## 2023-01-31 ENCOUNTER — Telehealth: Payer: Self-pay

## 2023-01-31 DIAGNOSIS — F32A Depression, unspecified: Secondary | ICD-10-CM | POA: Diagnosis not present

## 2023-01-31 DIAGNOSIS — C787 Secondary malignant neoplasm of liver and intrahepatic bile duct: Secondary | ICD-10-CM | POA: Diagnosis not present

## 2023-01-31 DIAGNOSIS — D509 Iron deficiency anemia, unspecified: Secondary | ICD-10-CM | POA: Diagnosis not present

## 2023-01-31 DIAGNOSIS — C259 Malignant neoplasm of pancreas, unspecified: Secondary | ICD-10-CM | POA: Diagnosis not present

## 2023-01-31 DIAGNOSIS — C250 Malignant neoplasm of head of pancreas: Secondary | ICD-10-CM | POA: Diagnosis not present

## 2023-01-31 DIAGNOSIS — Z8547 Personal history of malignant neoplasm of testis: Secondary | ICD-10-CM | POA: Diagnosis not present

## 2023-01-31 DIAGNOSIS — Z85810 Personal history of malignant neoplasm of tongue: Secondary | ICD-10-CM | POA: Diagnosis not present

## 2023-01-31 DIAGNOSIS — I10 Essential (primary) hypertension: Secondary | ICD-10-CM | POA: Diagnosis not present

## 2023-01-31 NOTE — Telephone Encounter (Signed)
Hospice called to make su aware that he is rescinding Hospice beginning 02/02/2023. He is taking trip to [place] for 20 days. He plans to connect with Hospice group there. Once he returns, he will resume Hospice here. Message sent to Dr Melvyn Neth.

## 2023-02-01 DIAGNOSIS — C250 Malignant neoplasm of head of pancreas: Secondary | ICD-10-CM | POA: Diagnosis not present

## 2023-02-01 DIAGNOSIS — Z85810 Personal history of malignant neoplasm of tongue: Secondary | ICD-10-CM | POA: Diagnosis not present

## 2023-02-01 DIAGNOSIS — Z8547 Personal history of malignant neoplasm of testis: Secondary | ICD-10-CM | POA: Diagnosis not present

## 2023-02-01 DIAGNOSIS — C259 Malignant neoplasm of pancreas, unspecified: Secondary | ICD-10-CM | POA: Diagnosis not present

## 2023-02-01 DIAGNOSIS — C787 Secondary malignant neoplasm of liver and intrahepatic bile duct: Secondary | ICD-10-CM | POA: Diagnosis not present

## 2023-02-01 DIAGNOSIS — D509 Iron deficiency anemia, unspecified: Secondary | ICD-10-CM | POA: Diagnosis not present

## 2023-02-01 DIAGNOSIS — I10 Essential (primary) hypertension: Secondary | ICD-10-CM | POA: Diagnosis not present

## 2023-02-01 DIAGNOSIS — F32A Depression, unspecified: Secondary | ICD-10-CM | POA: Diagnosis not present

## 2023-02-02 DIAGNOSIS — C787 Secondary malignant neoplasm of liver and intrahepatic bile duct: Secondary | ICD-10-CM | POA: Diagnosis not present

## 2023-02-02 DIAGNOSIS — C259 Malignant neoplasm of pancreas, unspecified: Secondary | ICD-10-CM | POA: Diagnosis not present

## 2023-02-02 DIAGNOSIS — F32A Depression, unspecified: Secondary | ICD-10-CM | POA: Diagnosis not present

## 2023-02-02 DIAGNOSIS — C250 Malignant neoplasm of head of pancreas: Secondary | ICD-10-CM | POA: Diagnosis not present

## 2023-02-02 DIAGNOSIS — Z85810 Personal history of malignant neoplasm of tongue: Secondary | ICD-10-CM | POA: Diagnosis not present

## 2023-02-02 DIAGNOSIS — Z8547 Personal history of malignant neoplasm of testis: Secondary | ICD-10-CM | POA: Diagnosis not present

## 2023-02-02 DIAGNOSIS — I10 Essential (primary) hypertension: Secondary | ICD-10-CM | POA: Diagnosis not present

## 2023-02-02 DIAGNOSIS — D509 Iron deficiency anemia, unspecified: Secondary | ICD-10-CM | POA: Diagnosis not present

## 2023-02-06 ENCOUNTER — Ambulatory Visit: Payer: BC Managed Care – PPO

## 2023-02-20 ENCOUNTER — Encounter: Payer: Self-pay | Admitting: Oncology

## 2023-02-26 DIAGNOSIS — Z8547 Personal history of malignant neoplasm of testis: Secondary | ICD-10-CM | POA: Insufficient documentation

## 2023-02-26 DIAGNOSIS — I10 Essential (primary) hypertension: Secondary | ICD-10-CM | POA: Insufficient documentation

## 2023-02-26 DIAGNOSIS — C01 Malignant neoplasm of base of tongue: Secondary | ICD-10-CM | POA: Insufficient documentation

## 2023-02-26 DIAGNOSIS — C140 Malignant neoplasm of pharynx, unspecified: Secondary | ICD-10-CM | POA: Insufficient documentation

## 2023-02-27 ENCOUNTER — Ambulatory Visit: Payer: BC Managed Care – PPO

## 2023-03-17 DEATH — deceased
